# Patient Record
Sex: Female | Born: 1970 | Race: White | Hispanic: No | Marital: Married | Smoking: Never smoker
Health system: Southern US, Community
[De-identification: ages and names within clinical notes are randomized; demographics above are authoritative.]

## PROBLEM LIST (undated history)

## (undated) DIAGNOSIS — F329 Major depressive disorder, single episode, unspecified: Secondary | ICD-10-CM

## (undated) DIAGNOSIS — E039 Hypothyroidism, unspecified: Secondary | ICD-10-CM

## (undated) DIAGNOSIS — E079 Disorder of thyroid, unspecified: Secondary | ICD-10-CM

## (undated) DIAGNOSIS — F32A Depression, unspecified: Secondary | ICD-10-CM

## (undated) DIAGNOSIS — M797 Fibromyalgia: Secondary | ICD-10-CM

## (undated) DIAGNOSIS — F419 Anxiety disorder, unspecified: Secondary | ICD-10-CM

## (undated) HISTORY — PX: TONSILLECTOMY: SUR1361

## (undated) HISTORY — DX: Anxiety disorder, unspecified: F41.9

## (undated) HISTORY — DX: Major depressive disorder, single episode, unspecified: F32.9

## (undated) HISTORY — DX: Depression, unspecified: F32.A

## (undated) HISTORY — PX: LAPAROSCOPY ABDOMEN DIAGNOSTIC: PRO50

## (undated) HISTORY — DX: Disorder of thyroid, unspecified: E07.9

## (undated) HISTORY — DX: Fibromyalgia: M79.7

## (undated) HISTORY — PX: ABDOMINAL HYSTERECTOMY: SHX81

---

## 2006-05-16 DIAGNOSIS — M797 Fibromyalgia: Secondary | ICD-10-CM

## 2006-05-16 HISTORY — DX: Fibromyalgia: M79.7

## 2009-09-13 LAB — HM PAP SMEAR

## 2010-09-23 ENCOUNTER — Other Ambulatory Visit (INDEPENDENT_AMBULATORY_CARE_PROVIDER_SITE_OTHER): Payer: BC Managed Care – PPO

## 2010-09-23 ENCOUNTER — Ambulatory Visit (INDEPENDENT_AMBULATORY_CARE_PROVIDER_SITE_OTHER): Payer: BC Managed Care – PPO | Admitting: Internal Medicine

## 2010-09-23 ENCOUNTER — Encounter: Payer: Self-pay | Admitting: Internal Medicine

## 2010-09-23 VITALS — BP 110/70 | HR 90 | Temp 98.9°F | Resp 16 | Ht 65.0 in | Wt 130.0 lb

## 2010-09-23 DIAGNOSIS — R7982 Elevated C-reactive protein (CRP): Secondary | ICD-10-CM

## 2010-09-23 DIAGNOSIS — M797 Fibromyalgia: Secondary | ICD-10-CM

## 2010-09-23 DIAGNOSIS — IMO0001 Reserved for inherently not codable concepts without codable children: Secondary | ICD-10-CM

## 2010-09-23 DIAGNOSIS — E039 Hypothyroidism, unspecified: Secondary | ICD-10-CM

## 2010-09-23 LAB — COMPREHENSIVE METABOLIC PANEL
Albumin: 3.7 g/dL (ref 3.5–5.2)
BUN: 8 mg/dL (ref 6–23)
CO2: 27 mEq/L (ref 19–32)
Calcium: 8.8 mg/dL (ref 8.4–10.5)
Chloride: 104 mEq/L (ref 96–112)
GFR: 98.23 mL/min (ref >60.00)
Glucose, Bld: 95 mg/dL (ref 70–99)
Potassium: 4.1 mEq/L (ref 3.5–5.1)
Total Protein: 6.7 g/dL (ref 6.0–8.3)

## 2010-09-23 LAB — CBC WITH DIFFERENTIAL/PLATELET
Basophils Relative: 0.4 % (ref 0.0–3.0)
Eosinophils Absolute: 0 10*3/uL (ref 0.0–0.7)
Eosinophils Relative: 0.1 % (ref 0.0–5.0)
Hemoglobin: 13.9 g/dL (ref 12.0–15.0)
Lymphocytes Relative: 12.6 % (ref 12.0–46.0)
MCHC: 35.4 g/dL (ref 30.0–36.0)
Neutro Abs: 4.8 10*3/uL (ref 1.4–7.7)
RBC: 4.55 Mil/uL (ref 3.87–5.11)
WBC: 5.9 10*3/uL (ref 4.5–10.5)

## 2010-09-23 LAB — SEDIMENTATION RATE: Sed Rate: 7 mm/hr (ref 0–22)

## 2010-09-23 LAB — HIGH SENSITIVITY CRP: CRP, High Sensitivity: 26.55 mg/L — ABNORMAL HIGH (ref 0.00–5.00)

## 2010-09-23 MED ORDER — BUPRENORPHINE 5 MCG/HR TD PTWK: 1.0000 | MEDICATED_PATCH | TRANSDERMAL | Status: DC

## 2010-09-23 NOTE — Patient Instructions (Addendum)
Hypothyroidism The thyroid is a large gland located in the lower front of your neck. The thyroid gland helps control metabolism. Metabolism is how your body handles food. It controls metabolism with the hormone thyroxine. When this gland is underactive (hypothyroid), it produces too little hormone.  SYMPTOMS OF HYPOTHYROIDISM  Lethargy (feeling as though you have no energy)   Cold intolerance   Weight gain (in spite of normal food intake)   Dry skin   Coarse hair   Menstrual irregularity (if severe, may lead to infertility)   Slowing of thought processes  Cardiac problems are also caused by insufficient amounts of thyroid hormone. Hypothyroidism in the newborn is cretinism, and is an extreme form. It is important that this form be treated adequately and immediately or it will lead rapidly to retarded physical and mental development. CAUSES OF HYPOTHYROIDISM These include:   Absence or destruction of thyroid tissue.  Goiter due to iodine deficiency.   Goiter due to medications.   Congenital defects (since birth).  Problems with the pituitary. This causes a lack of TSH (thyroid stimulating hormone). This hormone tells the thyroid to turn out more hormone.   DIAGNOSIS To prove hypothyroidism, your caregiver may do blood tests and ultrasound tests. Sometimes the signs are hidden. It may be necessary for your caregiver to watch this illness with blood tests either before or after diagnosis and treatment. TREATMENT  Low levels of thyroid hormone are increased by using synthetic thyroid hormone. This is a safe, effective treatment. It usually takes about four weeks to gain the full effects of the medication. After you have the full effect of the medication, it will generally take another four weeks for problems to leave. Your caregiver may start you on low doses. If you have had heart problems the dose may be gradually increased. It is generally not an emergency to get rapidly to  normal. HOME CARE INSTRUCTIONS  Take your medications as your caregiver suggests. Let your caregiver know of any medications you are taking or start taking. Your caregiver will help you with dosage schedules.   As your condition improves, your dosage needs may increase. It will be necessary to have continuing blood tests as suggested by your caregiver.   Report all suspected medication side effects to your caregiver.  SEEK MEDICAL CARE IF YOU DEVELOP:  Sweating.  Tremulousness (tremors).   Anxiety.   Rapid weight loss.   Heat intolerance.  Emotional swings.   Diarrhea.   Weakness.   SEEK IMMEDIATE MEDICAL CARE IF: You develop chest pain, an irregular heart beat (palpitations), or a rapid heart beat. MAKE SURE YOU:   Understand these instructions.   Will watch your condition.   Will get help right away if you are not doing well or get worse.  Document Released: 05/02/2005 Document Re-Released: 04/14/2008 [facility] Patient Information 2011 [place], [place].Fibromyalgia Fibromyalgia is a disorder that is often misunderstood. It is associated with muscular pains and tenderness that comes and goes. It is often associated with fatigue and sleep disturbances. Though it tends to be long-lasting, fibromyalgia is not life-threatening. CAUSES The exact cause of fibromyalgia is unknown. People with certain gene types are predisposed to developing fibromyalgia and other conditions. Certain factors can play a role as triggers, such as:  Spine disorders.   Arthritis.   Severe injury (trauma) and other physical stressors.   Emotional stressors.  SYMPTOMS  The main symptom is pain and stiffness in the muscles and joints, which can vary over time.  Sleep and fatigue problems.  Other related symptoms may include:  Bowel and bladder problems.   Headaches.   Visual problems.   Problems with odors and noises.   Depression or mood changes.   Painful periods (dysmenorrhea).    Dryness of the skin or eyes.  DIAGNOSIS There are no specific tests for diagnosing fibromyalgia. Patients can be diagnosed accurately from the specific symptoms they have. The diagnosis is made by determining that nothing else is causing the problems. TREATMENT There is no cure. Management includes medicines and an active, healthy lifestyle. The goal is to enhance physical fitness, decrease pain, and improve sleep. HOME CARE INSTRUCTIONS  Only take over-the-counter or prescription medicines as directed by your caregiver. Sleeping pills, tranquilizers, and pain medicines may make your problems worse.   Low-impact aerobic exercise is very important and advised for treatment. At first, it may seem to make pain worse. Gradually increasing your tolerance will overcome this feeling.   Learning relaxation techniques and how to control stress will help you. Biofeedback, visual imagery, hypnosis, muscle relaxation, yoga, and meditation are all options.   Anti-inflammatory medicines and physical therapy may provide short-term help.   Acupuncture or massage treatments may help.   Take muscle relaxant medicines as suggested by your caregiver.   Avoid stressful situations.   Plan a healthy lifestyle. This includes your diet, sleep, rest, exercise, and friends.   Find and practice a hobby you enjoy.   Join a fibromyalgia support group for interaction, ideas, and sharing advice. This may be helpful.  SEEK MEDICAL CARE IF: You are not having good results or improvement from your treatment. FOR MORE INFORMATION National Fibromyalgia Association: www.fmaware.org Arthritis Foundation: www.arthritis.org Document Released: 05/02/2005 Document Re-Released: 10/20/2009 [facility] Patient Information 2011 [place], [place].

## 2010-09-24 ENCOUNTER — Encounter: Payer: Self-pay | Admitting: Internal Medicine

## 2010-09-24 NOTE — Progress Notes (Signed)
Subjective:    Patient ID: [name], female    DOB: Jan 19, 1971, 40 y.o.   MRN: [id number]  Thyroid Problem Presents for follow-up visit. Patient reports no anxiety, cold intolerance, constipation, depressed mood, diaphoresis, diarrhea, dry skin, fatigue, hair loss, heat intolerance, hoarse voice, leg swelling, menstrual problem, nail problem, palpitations, tremors, visual change, weight gain or weight loss. The symptoms have been stable.   New to me she complains of widespread pain for 4 years and she tells me that a PCP and Rheumatologist told her that she had fibromyalgia (she has no records today). Over the years she has tried nsaids, tylenol, tramadol, lyrica, acupuncture, and amitryptiline with no benefit and lots of side effects. She also saw Dr. 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 Today she has diffuse muscle and joint pain (especially in her knees.) She has not taken anything recently for pain.    Review of Systems  Constitutional: Negative for fever, chills, weight loss, weight gain, diaphoresis, activity change, appetite change, fatigue and unexpected weight change.  HENT: Negative for hoarse voice, facial swelling, neck pain and neck stiffness.   Respiratory: Negative for apnea, cough, choking, chest tightness, shortness of breath, wheezing and stridor.   Cardiovascular: Negative for chest pain, palpitations and leg swelling.  Gastrointestinal: Negative for nausea, vomiting, abdominal pain, diarrhea and constipation.  Genitourinary: Negative for dysuria, urgency, hematuria, flank pain, decreased urine volume, difficulty urinating, menstrual problem and dyspareunia.  Musculoskeletal: Positive for myalgias, back pain and arthralgias. Negative for joint swelling and gait problem.  Skin: Negative for color change, pallor and rash.  Neurological: Negative for dizziness, tremors, seizures, syncope, facial asymmetry, speech difficulty, weakness,  light-headedness, numbness and headaches.  Hematological: Negative for cold intolerance and heat intolerance.  Psychiatric/Behavioral: Negative for suicidal ideas, hallucinations, behavioral problems, confusion, sleep disturbance, self-injury, dysphoric mood, decreased concentration and agitation. The patient is not nervous/anxious and is not hyperactive.        Objective:   Physical Exam  Vitals reviewed. Constitutional: She is oriented to person, place, and time. She appears well-developed and well-nourished. No distress.  HENT:  Head: Normocephalic and atraumatic.  Right Ear: External ear normal.  Left Ear: External ear normal.  Nose: Nose normal.  Mouth/Throat: Oropharynx is clear and moist. No oropharyngeal exudate.  Eyes: Conjunctivae and EOM are normal. Pupils are equal, round, and reactive to light. Right eye exhibits no discharge. Left eye exhibits no discharge. No scleral icterus.  Neck: Normal range of motion. Neck supple. No JVD present. No tracheal deviation present. No thyromegaly present.  Cardiovascular: Normal rate, regular rhythm, normal heart sounds and intact distal pulses.  Exam reveals no gallop and no friction rub.   No murmur heard. Pulmonary/Chest: Effort normal and breath sounds normal. No stridor. No respiratory distress. She has no wheezes. She has no rales. She exhibits no tenderness.  Abdominal: Soft. Bowel sounds are normal. She exhibits no distension and no mass. There is no tenderness. There is no rebound and no guarding.  Musculoskeletal: Normal range of motion. She exhibits no edema and no tenderness.  Lymphadenopathy:    She has no cervical adenopathy.  Neurological: She is alert and oriented to person, place, and time. She has normal reflexes. She displays normal reflexes. No cranial nerve deficit. She exhibits normal muscle tone. Coordination normal.  Skin: Skin is warm and dry. No rash noted. She is not diaphoretic. No erythema. No pallor.    Psychiatric: She has a normal mood  and affect. Her behavior is normal. Judgment and thought content normal.          Assessment & Plan:

## 2010-09-24 NOTE — Assessment & Plan Note (Signed)
Will try butrans for pain and check her labs to look for secondary causes

## 2010-09-24 NOTE — Assessment & Plan Note (Signed)
It sounds like she is euthyroid, I will check her TSH level today

## 2010-09-24 NOTE — Assessment & Plan Note (Signed)
Her CRP is high so I have referred her to Rheumatology for evaluation

## 2010-10-08 ENCOUNTER — Telehealth: Payer: Self-pay | Admitting: *Deleted

## 2010-10-08 NOTE — Telephone Encounter (Signed)
Pt had "very" elevated CRP and was seen by rheumatologist. She says they r/o arthritis as cause. MD told her that cancer is one of the reasons pt's have elevation in this test. She is very worried and wants to know what Dr Yetta Barre thinks. She has been using the Butrans patch but only get 3 to 4 days of limited relief from patch.

## 2010-10-11 NOTE — Telephone Encounter (Signed)
She needs to be seen.

## 2010-10-14 ENCOUNTER — Ambulatory Visit: Payer: BC Managed Care – PPO | Admitting: Internal Medicine

## 2010-10-15 ENCOUNTER — Other Ambulatory Visit (INDEPENDENT_AMBULATORY_CARE_PROVIDER_SITE_OTHER): Payer: BC Managed Care – PPO

## 2010-10-15 ENCOUNTER — Ambulatory Visit (INDEPENDENT_AMBULATORY_CARE_PROVIDER_SITE_OTHER): Payer: BC Managed Care – PPO | Admitting: Internal Medicine

## 2010-10-15 ENCOUNTER — Ambulatory Visit (INDEPENDENT_AMBULATORY_CARE_PROVIDER_SITE_OTHER)
Admission: RE | Admit: 2010-10-15 | Discharge: 2010-10-15 | Disposition: A | Discharge status: Home / Self Care | Payer: BC Managed Care – PPO | Source: Ambulatory Visit | Attending: Internal Medicine | Admitting: Internal Medicine

## 2010-10-15 ENCOUNTER — Encounter: Payer: Self-pay | Admitting: Internal Medicine

## 2010-10-15 VITALS — BP 110/68 | HR 84 | Temp 98.5°F | Resp 16 | Wt 129.0 lb

## 2010-10-15 DIAGNOSIS — M797 Fibromyalgia: Secondary | ICD-10-CM

## 2010-10-15 DIAGNOSIS — IMO0001 Reserved for inherently not codable concepts without codable children: Secondary | ICD-10-CM

## 2010-10-15 DIAGNOSIS — R599 Enlarged lymph nodes, unspecified: Secondary | ICD-10-CM

## 2010-10-15 DIAGNOSIS — E039 Hypothyroidism, unspecified: Secondary | ICD-10-CM

## 2010-10-15 DIAGNOSIS — R7982 Elevated C-reactive protein (CRP): Secondary | ICD-10-CM

## 2010-10-15 DIAGNOSIS — R590 Localized enlarged lymph nodes: Secondary | ICD-10-CM

## 2010-10-15 LAB — CBC WITH DIFFERENTIAL/PLATELET
Eosinophils Relative: 1 % (ref 0.0–5.0)
Lymphocytes Relative: 39.3 % (ref 12.0–46.0)
MCV: 85.7 fl (ref 78.0–100.0)
Monocytes Absolute: 0.4 10*3/uL (ref 0.1–1.0)
Monocytes Relative: 7.2 % (ref 3.0–12.0)
Neutrophils Relative %: 52.1 % (ref 43.0–77.0)
Platelets: 247 10*3/uL (ref 150.0–400.0)
RBC: 4.21 Mil/uL (ref 3.87–5.11)
WBC: 4.9 10*3/uL (ref 4.5–10.5)

## 2010-10-15 LAB — HEPATITIS C ANTIBODY: HCV Ab: NEGATIVE

## 2010-10-15 LAB — SEDIMENTATION RATE: Sed Rate: 7 mm/hr (ref 0–22)

## 2010-10-15 LAB — HIV ANTIBODY (ROUTINE TESTING W REFLEX): HIV: NONREACTIVE

## 2010-10-15 LAB — HIGH SENSITIVITY CRP: CRP, High Sensitivity: 2.53 mg/L (ref 0.00–5.00)

## 2010-10-15 MED ORDER — BUPRENORPHINE 10 MCG/HR TD PTWK: 1.0000 | MEDICATED_PATCH | TRANSDERMAL | Status: DC

## 2010-10-15 NOTE — Assessment & Plan Note (Signed)
Her TSH was recently normal

## 2010-10-15 NOTE — Patient Instructions (Signed)

## 2010-10-15 NOTE — Assessment & Plan Note (Signed)
Increased strength of butrans today

## 2010-10-15 NOTE — Assessment & Plan Note (Signed)
The nodes feel benign but I will check some labs to look for other causes

## 2010-10-15 NOTE — Assessment & Plan Note (Signed)
I will repeat the CRP level today and look at some other labs to screen for lymphoproliferative disease

## 2010-10-15 NOTE — Progress Notes (Signed)
Subjective:    Patient ID: [name], female    DOB: May 29, 1970, 40 y.o.   MRN: [id number]  HPI She returns for f/up and she tells me that she saw Dr. Kellie Simmering (Rheumatology) and he told her that she did not have any evidence of arthritis but he was concerned that the elevated CRP may be a sign of cancer so she returns for further evaluation. She tells me that the [facility] patch has helped some but she would like to try a higher dose.   Review of Systems  Constitutional: Negative for fever, chills, diaphoresis, activity change, appetite change, fatigue and unexpected weight change.  HENT: Negative for nosebleeds, sore throat, facial swelling, trouble swallowing, neck pain, neck stiffness and voice change.   Eyes: Negative for photophobia and visual disturbance.  Respiratory: Negative for cough, chest tightness, shortness of breath, wheezing and stridor.   Cardiovascular: Negative for chest pain, palpitations and leg swelling.  Gastrointestinal: Negative for nausea, vomiting, abdominal pain, diarrhea, constipation, blood in stool, abdominal distention, anal bleeding and rectal pain.  Genitourinary: Negative for dysuria, urgency, frequency, hematuria, flank pain, decreased urine volume, vaginal bleeding, vaginal discharge, enuresis, difficulty urinating, genital sores, vaginal pain, menstrual problem, pelvic pain and dyspareunia.  Musculoskeletal: Positive for myalgias and arthralgias. Negative for back pain, joint swelling and gait problem.  Skin: Negative for color change, pallor and rash.  Neurological: Negative for dizziness, tremors, seizures, syncope, facial asymmetry, speech difficulty, weakness, light-headedness, numbness and headaches.  Hematological: Negative for adenopathy. Does not bruise/bleed easily.  Psychiatric/Behavioral: Negative for suicidal ideas, hallucinations, behavioral problems, confusion, sleep disturbance, self-injury, dysphoric mood, decreased concentration and  agitation. The patient is not nervous/anxious and is not hyperactive.        Objective:   Physical Exam  [vitalsreviewed. Constitutional: She is oriented to person, place, and time. She appears well-developed and well-nourished. No distress.  HENT:  Head: Normocephalic and atraumatic.  Right Ear: External ear normal.  Left Ear: External ear normal.  Nose: Nose normal.  Mouth/Throat: Oropharynx is clear and moist. No oropharyngeal exudate.  Eyes: Conjunctivae and EOM are normal. Pupils are equal, round, and reactive to light. Right eye exhibits no discharge. Left eye exhibits no discharge. No scleral icterus.  Neck: Normal range of motion. Neck supple. No JVD present. No tracheal deviation present. No thyromegaly present.  Cardiovascular: Normal rate, regular rhythm, normal heart sounds and intact distal pulses.  Exam reveals no gallop and no friction rub.   No murmur heard. Pulmonary/Chest: Effort normal and breath sounds normal. No stridor. No respiratory distress. She has no wheezes. She has no rales. She exhibits no tenderness.  Abdominal: Soft. Bowel sounds are normal. She exhibits no distension and no mass. There is no tenderness. There is no rebound and no guarding.  Musculoskeletal: Normal range of motion. She exhibits no edema and no tenderness.  Lymphadenopathy:       Head (right side): No submental, no submandibular, no tonsillar, no preauricular, no posterior auricular and no occipital adenopathy present.       Head (left side): No submental, no submandibular, no tonsillar, no preauricular, no posterior auricular and no occipital adenopathy present.    She has no cervical adenopathy.       Right cervical: No superficial cervical, no deep cervical and no posterior cervical adenopathy present.      Left cervical: No superficial cervical, no deep cervical and no posterior cervical adenopathy present.       Right axillary: No pectoral and no lateral  adenopathy present.       Left  axillary: No pectoral and no lateral adenopathy present.      Right: Inguinal adenopathy present. No supraclavicular and no epitrochlear adenopathy present.       Left: Inguinal adenopathy present. No supraclavicular and no epitrochlear adenopathy present.  Neurological: She is alert and oriented to person, place, and time. She has normal reflexes. She displays normal reflexes. No cranial nerve deficit. She exhibits normal muscle tone. Coordination normal.  Skin: Skin is warm and dry. No rash noted. She is not diaphoretic. No erythema. No pallor.  Psychiatric: She has a normal mood and affect. Her behavior is normal. Judgment and thought content normal.        Lab Results  Component Value Date   WBC 4.9 10/15/2010   HGB 12.6 10/15/2010   HCT 36.1 10/15/2010   PLT 247.0 10/15/2010   ALT 16 09/23/2010   AST 16 09/23/2010   NA 138 09/23/2010   K 4.1 09/23/2010   CL 104 09/23/2010   CREATININE 0.7 09/23/2010   BUN 8 09/23/2010   CO2 27 09/23/2010   TSH 1.25 09/23/2010    Assessment & Plan:

## 2010-10-19 LAB — PROTEIN ELECTROPHORESIS, SERUM
Albumin ELP: 62 % (ref 55.8–66.1)
Alpha-2-Globulin: 9.6 % (ref 7.1–11.8)
Total Protein, Serum Electrophoresis: 6.7 g/dL (ref 6.0–8.3)

## 2010-10-22 ENCOUNTER — Telehealth: Payer: Self-pay

## 2010-10-22 NOTE — Telephone Encounter (Signed)
Patient called [facility] requesting a call back to discuss med change (didnt mention name of med).

## 2010-11-04 ENCOUNTER — Telehealth: Payer: Self-pay | Admitting: *Deleted

## 2010-11-04 DIAGNOSIS — M797 Fibromyalgia: Secondary | ICD-10-CM

## 2010-11-04 MED ORDER — HYDROCODONE-ACETAMINOPHEN 5-500 MG PO TABS: 2.0000 | ORAL_TABLET | Freq: Three times a day (TID) | ORAL | Status: DC | PRN

## 2010-11-04 NOTE — Telephone Encounter (Signed)
pls call it in 

## 2010-11-04 NOTE — Telephone Encounter (Signed)
Pt was given Rx for butrans patch but insurance does not cover med. She would like an alternative RX.

## 2010-11-04 NOTE — Telephone Encounter (Signed)
Patient informed, RX CALLED IN

## 2010-11-15 ENCOUNTER — Encounter: Payer: Self-pay | Admitting: Internal Medicine

## 2010-11-15 ENCOUNTER — Ambulatory Visit: Payer: BC Managed Care – PPO | Admitting: Internal Medicine

## 2010-11-15 ENCOUNTER — Ambulatory Visit (INDEPENDENT_AMBULATORY_CARE_PROVIDER_SITE_OTHER): Payer: BC Managed Care – PPO | Admitting: Internal Medicine

## 2010-11-15 VITALS — BP 116/68 | HR 73 | Temp 98.6°F | Wt 130.0 lb

## 2010-11-15 DIAGNOSIS — E039 Hypothyroidism, unspecified: Secondary | ICD-10-CM

## 2010-11-15 DIAGNOSIS — M5416 Radiculopathy, lumbar region: Secondary | ICD-10-CM

## 2010-11-15 DIAGNOSIS — M797 Fibromyalgia: Secondary | ICD-10-CM

## 2010-11-15 DIAGNOSIS — R209 Unspecified disturbances of skin sensation: Secondary | ICD-10-CM

## 2010-11-15 DIAGNOSIS — R200 Anesthesia of skin: Secondary | ICD-10-CM | POA: Insufficient documentation

## 2010-11-15 DIAGNOSIS — Z23 Encounter for immunization: Secondary | ICD-10-CM

## 2010-11-15 DIAGNOSIS — IMO0002 Reserved for concepts with insufficient information to code with codable children: Secondary | ICD-10-CM

## 2010-11-15 DIAGNOSIS — IMO0001 Reserved for inherently not codable concepts without codable children: Secondary | ICD-10-CM

## 2010-11-15 MED ORDER — MILNACIPRAN HCL 12.5 & 25 & 50 MG PO MISC: 50.0000 mg | Freq: Two times a day (BID) | ORAL | Status: DC

## 2010-11-15 NOTE — Assessment & Plan Note (Signed)
Stop Vicodin and try Savella

## 2010-11-15 NOTE — Assessment & Plan Note (Signed)
Check NCS and EMG

## 2010-11-15 NOTE — Patient Instructions (Signed)

## 2010-11-15 NOTE — Assessment & Plan Note (Signed)
Check NCS and EMG 

## 2010-11-15 NOTE — Progress Notes (Signed)
Subjective:    Patient ID: [name], female    DOB: [birth year], 40 y.o.   MRN: [id number]  HPI She returns for f/up and tells me that she has persistent pain and stiffness, mostly in her hands. She was not able to afford Butrans patch and says that Vicodin did not help. She has never tried [place] but she did try Cymbalta, Lyrica, and Amitryptiline and did not get any relief. She saw Dr. Kellie Simmering and he told her that she did not have arthritis.  Also, she needs a f/up on low back pain. She tells me that in 2008 she saw an ortho and was told that she had DDD and a pinched nerve but she did not have any surgery done. She has persistent LBP that shoots into both legs and has intermittent numbness in both of her legs.    Review of Systems  Constitutional: Negative for fever, chills, diaphoresis, activity change, appetite change, fatigue and unexpected weight change.  HENT: Negative for sore throat, facial swelling, trouble swallowing, neck pain, neck stiffness and voice change.   Eyes: Negative.   Respiratory: Negative for apnea, cough, choking, chest tightness, shortness of breath, wheezing and stridor.   Cardiovascular: Negative for chest pain, palpitations and leg swelling.  Gastrointestinal: Negative for nausea, vomiting, abdominal pain, diarrhea, constipation, blood in stool, abdominal distention, anal bleeding and rectal pain.  Genitourinary: Negative for dysuria, urgency, frequency, hematuria, flank pain, decreased urine volume, enuresis, difficulty urinating, genital sores and dyspareunia.  Musculoskeletal: Positive for myalgias and arthralgias. Negative for back pain, joint swelling and gait problem.  Skin: Negative for color change, pallor, rash and wound.  Neurological: Negative for dizziness, tremors, seizures, syncope, facial asymmetry, speech difficulty, weakness, light-headedness, numbness and headaches.  Hematological: Negative for adenopathy. Does not bruise/bleed easily.    Psychiatric/Behavioral: Negative for suicidal ideas, hallucinations, behavioral problems, confusion, sleep disturbance, self-injury, dysphoric mood, decreased concentration and agitation. The patient is not nervous/anxious and is not hyperactive.        Objective:   Physical Exam  Vitals reviewed. Constitutional: She is oriented to person, place, and time. She appears well-developed and well-nourished. No distress.  HENT:  Head: Normocephalic and atraumatic.  Right Ear: External ear normal.  Left Ear: External ear normal.  Nose: Nose normal.  Mouth/Throat: Oropharynx is clear and moist. No oropharyngeal exudate.  Eyes: Conjunctivae and EOM are normal. Pupils are equal, round, and reactive to light. Right eye exhibits no discharge. Left eye exhibits no discharge. No scleral icterus.  Neck: Normal range of motion. Neck supple. No JVD present. No tracheal deviation present. No thyromegaly present.  Cardiovascular: Normal rate, regular rhythm, normal heart sounds and intact distal pulses.  Exam reveals no gallop and no friction rub.   No murmur heard. Pulmonary/Chest: Effort normal and breath sounds normal. No stridor. No respiratory distress. She has no wheezes. She has no rales. She exhibits no tenderness.  Abdominal: Soft. Bowel sounds are normal. She exhibits no distension and no mass. There is no tenderness. There is no rebound and no guarding.  Musculoskeletal: Normal range of motion. She exhibits no edema and no tenderness.       Lumbar back: Normal. She exhibits normal range of motion, no tenderness, no bony tenderness, no swelling, no edema, no deformity, no laceration, no pain and no spasm.  Lymphadenopathy:    She has no cervical adenopathy.  Neurological: She is alert and oriented to person, place, and time. She displays no atrophy, no tremor and normal reflexes.  No cranial nerve deficit or sensory deficit. She exhibits normal muscle tone. She displays a negative Romberg sign.  Coordination and gait normal.  Reflex Scores:      Tricep reflexes are 1+ on the right side and 1+ on the left side.      Bicep reflexes are 1+ on the right side and 1+ on the left side.      Brachioradialis reflexes are 1+ on the right side and 1+ on the left side.      Patellar reflexes are 1+ on the right side and 1+ on the left side.      Achilles reflexes are 1+ on the right side and 1+ on the left side. Skin: Skin is warm and dry. No rash noted. She is not diaphoretic. No erythema. No pallor.  Psychiatric: She has a normal mood and affect. Her behavior is normal. Judgment and thought content normal.        Lab Results  Component Value Date   WBC 4.9 10/15/2010   HGB 12.6 10/15/2010   HCT 36.1 10/15/2010   PLT 247.0 10/15/2010   ALT 16 09/23/2010   AST 16 09/23/2010   NA 138 09/23/2010   K 4.1 09/23/2010   CL 104 09/23/2010   CREATININE 0.7 09/23/2010   BUN 8 09/23/2010   CO2 27 09/23/2010   TSH 1.25 09/23/2010    Assessment & Plan:

## 2010-11-15 NOTE — Assessment & Plan Note (Signed)
Recent TSH was normal 

## 2010-12-15 ENCOUNTER — Ambulatory Visit: Payer: BC Managed Care – PPO | Admitting: Internal Medicine

## 2012-01-03 ENCOUNTER — Other Ambulatory Visit (HOSPITAL_COMMUNITY): Payer: Self-pay | Admitting: Orthopaedic Surgery

## 2012-01-03 DIAGNOSIS — M549 Dorsalgia, unspecified: Secondary | ICD-10-CM

## 2012-01-03 DIAGNOSIS — R200 Anesthesia of skin: Secondary | ICD-10-CM

## 2012-01-06 ENCOUNTER — Ambulatory Visit (HOSPITAL_COMMUNITY)
Admission: RE | Admit: 2012-01-06 | Discharge: 2012-01-06 | Disposition: A | Discharge status: Home / Self Care | Payer: Self-pay | Source: Ambulatory Visit | Attending: Orthopaedic Surgery | Admitting: Orthopaedic Surgery

## 2012-01-06 DIAGNOSIS — M549 Dorsalgia, unspecified: Secondary | ICD-10-CM

## 2012-01-06 DIAGNOSIS — R200 Anesthesia of skin: Secondary | ICD-10-CM

## 2012-01-12 ENCOUNTER — Ambulatory Visit (HOSPITAL_COMMUNITY)
Admission: RE | Admit: 2012-01-12 | Discharge: 2012-01-12 | Disposition: A | Discharge status: Home / Self Care | Payer: Self-pay | Source: Ambulatory Visit | Attending: Orthopaedic Surgery | Admitting: Orthopaedic Surgery

## 2012-01-12 DIAGNOSIS — M545 Low back pain, unspecified: Secondary | ICD-10-CM | POA: Insufficient documentation

## 2012-01-12 DIAGNOSIS — R209 Unspecified disturbances of skin sensation: Secondary | ICD-10-CM | POA: Insufficient documentation

## 2013-01-15 ENCOUNTER — Encounter (HOSPITAL_COMMUNITY): Payer: Self-pay | Admitting: Pharmacist

## 2013-01-22 ENCOUNTER — Other Ambulatory Visit: Payer: Self-pay | Admitting: Obstetrics and Gynecology

## 2013-01-31 ENCOUNTER — Encounter (HOSPITAL_COMMUNITY)
Admission: RE | Admit: 2013-01-31 | Discharge: 2013-01-31 | Disposition: A | Discharge status: Home / Self Care | Payer: BC Managed Care – PPO | Source: Ambulatory Visit | Attending: Obstetrics and Gynecology | Admitting: Obstetrics and Gynecology

## 2013-01-31 ENCOUNTER — Encounter (HOSPITAL_COMMUNITY): Payer: Self-pay

## 2013-01-31 DIAGNOSIS — Z01812 Encounter for preprocedural laboratory examination: Secondary | ICD-10-CM | POA: Insufficient documentation

## 2013-01-31 DIAGNOSIS — Z01818 Encounter for other preprocedural examination: Secondary | ICD-10-CM | POA: Insufficient documentation

## 2013-01-31 HISTORY — DX: Hypothyroidism, unspecified: E03.9

## 2013-01-31 LAB — CBC
Hemoglobin: 13.7 g/dL (ref 12.0–15.0)
MCH: 29.1 pg (ref 26.0–34.0)
MCHC: 35.3 g/dL (ref 30.0–36.0)
MCV: 82.6 fL (ref 78.0–100.0)
RBC: 4.7 MIL/uL (ref 3.87–5.11)

## 2013-01-31 NOTE — Patient Instructions (Addendum)
[address] Lou[address]co  01/31/2013   Your procedure is scheduled on:  02/05/13  Enter through the Main Entrance of [facility] at 6 AM.  Pick up the phone at the desk and dial [phone].   Call this number if you have problems the morning of surgery: [phone]   Remember:   Do not eat food:After Midnight.  Do not drink clear liquids: After Midnight.  Take these medicines the morning of surgery with A SIP OF WATER: NA   Do not wear jewelry, make-up or nail polish.  Do not wear lotions, powders, or perfumes. You may wear deodorant.  Do not shave 48 hours prior to surgery.  Do not bring valuables to the hospital.  [facility] is not   responsible for any belongings or valuables brought to the hospital.  Contacts, dentures or bridgework may not be worn into surgery.  Leave suitcase in the car. After surgery it may be brought to your room.  For patients admitted to the hospital, checkout time is 11:00 AM the day of              discharge.   Patients discharged the day of surgery will not be allowed to drive             home.  Name and phone number of your driver: NA  Special Instructions:   Shower using CHG 2 nights before surgery and the night before surgery.  If you shower the day of surgery use CHG.  Use special wash - you have one bottle of CHG for all showers.  You should use approximately 1/3 of the bottle for each shower.   Please read over the following fact sheets that you were given:   Surgical Site Infection Prevention

## 2013-02-05 ENCOUNTER — Encounter (HOSPITAL_COMMUNITY)
Admission: RE | Disposition: A | Discharge status: Home / Self Care | Payer: Self-pay | Source: Ambulatory Visit | Attending: Obstetrics and Gynecology

## 2013-02-05 ENCOUNTER — Encounter (HOSPITAL_COMMUNITY): Payer: Self-pay | Admitting: Certified Registered Nurse Anesthetist

## 2013-02-05 ENCOUNTER — Encounter (HOSPITAL_COMMUNITY): Payer: Self-pay | Admitting: *Deleted

## 2013-02-05 ENCOUNTER — Observation Stay (HOSPITAL_COMMUNITY)
Admission: RE | Admit: 2013-02-05 | Discharge: 2013-02-06 | Disposition: A | Discharge status: Home / Self Care | Payer: BC Managed Care – PPO | Source: Ambulatory Visit | Attending: Obstetrics and Gynecology | Admitting: Obstetrics and Gynecology

## 2013-02-05 ENCOUNTER — Ambulatory Visit (HOSPITAL_COMMUNITY): Payer: BC Managed Care – PPO | Admitting: Certified Registered Nurse Anesthetist

## 2013-02-05 DIAGNOSIS — N803 Endometriosis of pelvic peritoneum, unspecified: Secondary | ICD-10-CM | POA: Insufficient documentation

## 2013-02-05 DIAGNOSIS — N946 Dysmenorrhea, unspecified: Secondary | ICD-10-CM | POA: Insufficient documentation

## 2013-02-05 DIAGNOSIS — D252 Subserosal leiomyoma of uterus: Secondary | ICD-10-CM | POA: Insufficient documentation

## 2013-02-05 DIAGNOSIS — D250 Submucous leiomyoma of uterus: Secondary | ICD-10-CM | POA: Insufficient documentation

## 2013-02-05 DIAGNOSIS — R102 Pelvic and perineal pain: Secondary | ICD-10-CM

## 2013-02-05 DIAGNOSIS — G8929 Other chronic pain: Principal | ICD-10-CM | POA: Insufficient documentation

## 2013-02-05 DIAGNOSIS — N949 Unspecified condition associated with female genital organs and menstrual cycle: Secondary | ICD-10-CM | POA: Insufficient documentation

## 2013-02-05 DIAGNOSIS — IMO0002 Reserved for concepts with insufficient information to code with codable children: Secondary | ICD-10-CM | POA: Insufficient documentation

## 2013-02-05 HISTORY — PX: LAPAROSCOPY: SHX197

## 2013-02-05 HISTORY — PX: VAGINAL HYSTERECTOMY: SHX2639

## 2013-02-05 HISTORY — PX: ABLATION ON ENDOMETRIOSIS: SHX5787

## 2013-02-05 LAB — PREGNANCY, URINE: Preg Test, Ur: NEGATIVE

## 2013-02-05 SURGERY — HYSTERECTOMY, VAGINAL
Anesthesia: General | Site: Vagina | Wound class: Clean Contaminated

## 2013-02-05 MED ORDER — ONDANSETRON HCL 4 MG/2ML IJ SOLN
INTRAMUSCULAR | Status: DC | PRN
  Administered 2013-02-05 (×2): 2 mg via INTRAVENOUS

## 2013-02-05 MED ORDER — ZOLPIDEM TARTRATE 5 MG PO TABS: 5.0000 mg | ORAL_TABLET | Freq: Every evening | ORAL | Status: DC | PRN

## 2013-02-05 MED ORDER — DOCUSATE SODIUM 100 MG PO CAPS
100.0000 mg | ORAL_CAPSULE | Freq: Two times a day (BID) | ORAL | Status: DC
  Administered 2013-02-05 – 2013-02-06 (×2): 100 mg via ORAL
  Filled 2013-02-05 (×2): qty 1

## 2013-02-05 MED ORDER — DEXAMETHASONE SODIUM PHOSPHATE 10 MG/ML IJ SOLN
INTRAMUSCULAR | Status: DC | PRN
  Administered 2013-02-05: 10 mg via INTRAVENOUS

## 2013-02-05 MED ORDER — SODIUM CHLORIDE 0.9 % IJ SOLN: 9.0000 mL | INTRAMUSCULAR | Status: DC | PRN

## 2013-02-05 MED ORDER — LIDOCAINE-EPINEPHRINE 1 %-1:100000 IJ SOLN
INTRAMUSCULAR | Status: DC | PRN
  Administered 2013-02-05: 7 mg

## 2013-02-05 MED ORDER — ONDANSETRON HCL 4 MG PO TABS: 4.0000 mg | ORAL_TABLET | Freq: Four times a day (QID) | ORAL | Status: DC | PRN

## 2013-02-05 MED ORDER — ONDANSETRON HCL 4 MG/2ML IJ SOLN
4.0000 mg | Freq: Four times a day (QID) | INTRAMUSCULAR | Status: DC | PRN
  Administered 2013-02-05: 4 mg via INTRAVENOUS

## 2013-02-05 MED ORDER — ROCURONIUM BROMIDE 50 MG/5ML IV SOLN
INTRAVENOUS | Status: AC
  Filled 2013-02-05: qty 1

## 2013-02-05 MED ORDER — PROPOFOL 10 MG/ML IV EMUL
INTRAVENOUS | Status: AC
  Filled 2013-02-05: qty 20

## 2013-02-05 MED ORDER — DIPHENHYDRAMINE HCL 12.5 MG/5ML PO ELIX: 12.5000 mg | ORAL_SOLUTION | Freq: Four times a day (QID) | ORAL | Status: DC | PRN

## 2013-02-05 MED ORDER — ACETAMINOPHEN 160 MG/5ML PO SOLN
975.0000 mg | Freq: Four times a day (QID) | ORAL | Status: DC | PRN
  Administered 2013-02-05: 975 mg via ORAL

## 2013-02-05 MED ORDER — ONDANSETRON HCL 4 MG/2ML IJ SOLN
INTRAMUSCULAR | Status: AC
  Filled 2013-02-05: qty 2

## 2013-02-05 MED ORDER — GLYCOPYRROLATE 0.2 MG/ML IJ SOLN
INTRAMUSCULAR | Status: AC
  Filled 2013-02-05: qty 3

## 2013-02-05 MED ORDER — HYDROMORPHONE 0.3 MG/ML IV SOLN
INTRAVENOUS | Status: DC
  Administered 2013-02-05: 7.7 mL via INTRAVENOUS
  Administered 2013-02-05: 0.2 mL via INTRAVENOUS
  Administered 2013-02-05: 4.12 mg via INTRAVENOUS
  Administered 2013-02-05: 15.33 mL via INTRAVENOUS
  Filled 2013-02-05 (×2): qty 25

## 2013-02-05 MED ORDER — MIDAZOLAM HCL 2 MG/2ML IJ SOLN
INTRAMUSCULAR | Status: DC | PRN
  Administered 2013-02-05: .5 mg via INTRAVENOUS
  Administered 2013-02-05: 1.5 mg via INTRAVENOUS

## 2013-02-05 MED ORDER — FENTANYL CITRATE 0.05 MG/ML IJ SOLN
INTRAMUSCULAR | Status: DC | PRN
  Administered 2013-02-05 (×3): 50 ug via INTRAVENOUS
  Administered 2013-02-05: 100 ug via INTRAVENOUS

## 2013-02-05 MED ORDER — BUPIVACAINE HCL (PF) 0.25 % IJ SOLN
INTRAMUSCULAR | Status: DC | PRN
  Administered 2013-02-05: 4 mL

## 2013-02-05 MED ORDER — SIMETHICONE 80 MG PO CHEW: 80.0000 mg | CHEWABLE_TABLET | Freq: Four times a day (QID) | ORAL | Status: DC | PRN

## 2013-02-05 MED ORDER — HYDROMORPHONE HCL PF 1 MG/ML IJ SOLN
INTRAMUSCULAR | Status: AC
  Filled 2013-02-05: qty 1

## 2013-02-05 MED ORDER — HYDROMORPHONE HCL PF 1 MG/ML IJ SOLN
0.2500 mg | INTRAMUSCULAR | Status: DC | PRN
  Administered 2013-02-05 (×6): 0.5 mg via INTRAVENOUS

## 2013-02-05 MED ORDER — DEXTROSE-NACL 5-0.45 % IV SOLN
INTRAVENOUS | Status: DC
  Administered 2013-02-05: 19:00:00 via INTRAVENOUS
  Administered 2013-02-05: 125 mL via INTRAVENOUS
  Administered 2013-02-06: 03:00:00 via INTRAVENOUS

## 2013-02-05 MED ORDER — FENTANYL CITRATE 0.05 MG/ML IJ SOLN
INTRAMUSCULAR | Status: AC
  Filled 2013-02-05: qty 5

## 2013-02-05 MED ORDER — CEFAZOLIN SODIUM-DEXTROSE 2-3 GM-% IV SOLR
2.0000 g | INTRAVENOUS | Status: AC
  Administered 2013-02-05: 2 g via INTRAVENOUS

## 2013-02-05 MED ORDER — DIPHENHYDRAMINE HCL 50 MG/ML IJ SOLN: 12.5000 mg | Freq: Four times a day (QID) | INTRAMUSCULAR | Status: DC | PRN

## 2013-02-05 MED ORDER — OXYCODONE-ACETAMINOPHEN 5-325 MG PO TABS
1.0000 | ORAL_TABLET | ORAL | Status: DC | PRN
  Administered 2013-02-05 – 2013-02-06 (×3): 2 via ORAL
  Filled 2013-02-05 (×2): qty 2

## 2013-02-05 MED ORDER — DEXAMETHASONE SODIUM PHOSPHATE 10 MG/ML IJ SOLN
INTRAMUSCULAR | Status: AC
  Filled 2013-02-05: qty 1

## 2013-02-05 MED ORDER — PROPOFOL 10 MG/ML IV BOLUS
INTRAVENOUS | Status: DC | PRN
  Administered 2013-02-05: 150 mg via INTRAVENOUS

## 2013-02-05 MED ORDER — HYDROMORPHONE HCL PF 1 MG/ML IJ SOLN
INTRAMUSCULAR | Status: DC | PRN
  Administered 2013-02-05: 1 mg via INTRAVENOUS

## 2013-02-05 MED ORDER — ONDANSETRON HCL 4 MG/2ML IJ SOLN
4.0000 mg | Freq: Four times a day (QID) | INTRAMUSCULAR | Status: DC | PRN
  Filled 2013-02-05: qty 2

## 2013-02-05 MED ORDER — ACETAMINOPHEN 160 MG/5ML PO SOLN
ORAL | Status: AC
  Administered 2013-02-05: 975 mg via ORAL
  Filled 2013-02-05: qty 40.6

## 2013-02-05 MED ORDER — LACTATED RINGERS IV SOLN
INTRAVENOUS | Status: DC
  Administered 2013-02-05 (×3): via INTRAVENOUS

## 2013-02-05 MED ORDER — LIDOCAINE HCL (CARDIAC) 20 MG/ML IV SOLN
INTRAVENOUS | Status: DC | PRN
  Administered 2013-02-05: 50 mg via INTRAVENOUS

## 2013-02-05 MED ORDER — LEVOTHYROXINE SODIUM 75 MCG PO TABS
75.0000 ug | ORAL_TABLET | Freq: Every day | ORAL | Status: DC
  Administered 2013-02-05 – 2013-02-06 (×2): 75 ug via ORAL
  Filled 2013-02-05 (×3): qty 1

## 2013-02-05 MED ORDER — HYDROMORPHONE HCL PF 1 MG/ML IJ SOLN
INTRAMUSCULAR | Status: AC
  Administered 2013-02-05: 0.5 mg via INTRAVENOUS
  Filled 2013-02-05: qty 1

## 2013-02-05 MED ORDER — KETOROLAC TROMETHAMINE 30 MG/ML IJ SOLN
INTRAMUSCULAR | Status: DC | PRN
  Administered 2013-02-05: 30 mg via INTRAVENOUS

## 2013-02-05 MED ORDER — ROCURONIUM BROMIDE 100 MG/10ML IV SOLN
INTRAVENOUS | Status: DC | PRN
  Administered 2013-02-05: 30 mg via INTRAVENOUS

## 2013-02-05 MED ORDER — NEOSTIGMINE METHYLSULFATE 1 MG/ML IJ SOLN
INTRAMUSCULAR | Status: AC
  Filled 2013-02-05: qty 1

## 2013-02-05 MED ORDER — KETOROLAC TROMETHAMINE 30 MG/ML IJ SOLN
INTRAMUSCULAR | Status: AC
  Filled 2013-02-05: qty 1

## 2013-02-05 MED ORDER — BUPIVACAINE HCL (PF) 0.25 % IJ SOLN
INTRAMUSCULAR | Status: AC
  Filled 2013-02-05: qty 30

## 2013-02-05 MED ORDER — GLYCOPYRROLATE 0.2 MG/ML IJ SOLN
INTRAMUSCULAR | Status: DC | PRN
  Administered 2013-02-05: 0.4 mg via INTRAVENOUS
  Administered 2013-02-05 (×2): 0.1 mg via INTRAVENOUS

## 2013-02-05 MED ORDER — NALOXONE HCL 0.4 MG/ML IJ SOLN: 0.4000 mg | INTRAMUSCULAR | Status: DC | PRN

## 2013-02-05 MED ORDER — LIDOCAINE HCL (CARDIAC) 20 MG/ML IV SOLN
INTRAVENOUS | Status: AC
  Filled 2013-02-05: qty 5

## 2013-02-05 MED ORDER — MIDAZOLAM HCL 2 MG/2ML IJ SOLN
INTRAMUSCULAR | Status: AC
  Filled 2013-02-05: qty 2

## 2013-02-05 MED ORDER — NEOSTIGMINE METHYLSULFATE 1 MG/ML IJ SOLN
INTRAMUSCULAR | Status: DC | PRN
  Administered 2013-02-05: 3 mg via INTRAVENOUS

## 2013-02-05 MED ORDER — CEFAZOLIN SODIUM-DEXTROSE 2-3 GM-% IV SOLR
INTRAVENOUS | Status: AC
  Filled 2013-02-05: qty 50

## 2013-02-05 SURGICAL SUPPLY — 33 items
CATH ROBINSON RED A/P 16FR (CATHETERS) IMPLANT
CLOTH BEACON ORANGE TIMEOUT ST (SAFETY) ×4 IMPLANT
COVER TABLE BACK 60X90 (DRAPES) ×4 IMPLANT
DECANTER SPIKE VIAL GLASS SM (MISCELLANEOUS) IMPLANT
DERMABOND ADVANCED (GAUZE/BANDAGES/DRESSINGS) ×1
DERMABOND ADVANCED .7 DNX12 (GAUZE/BANDAGES/DRESSINGS) ×3 IMPLANT
ELECT REM PT RETURN 9FT ADLT (ELECTROSURGICAL)
ELECTRODE REM PT RTRN 9FT ADLT (ELECTROSURGICAL) IMPLANT
EVACUATOR SMOKE 8.L (FILTER) IMPLANT
FORCEPS CUTTING 33CM 5MM (CUTTING FORCEPS) IMPLANT
GLOVE ECLIPSE 7.0 STRL STRAW (GLOVE) ×8 IMPLANT
GOWN STRL REIN XL XLG (GOWN DISPOSABLE) ×16 IMPLANT
NS IRRIG 1000ML POUR BTL (IV SOLUTION) ×4 IMPLANT
PACK LAVH (CUSTOM PROCEDURE TRAY) ×4 IMPLANT
PROTECTOR NERVE ULNAR (MISCELLANEOUS) ×4 IMPLANT
SCISSORS LAP 5X35 DISP (ENDOMECHANICALS) IMPLANT
SET IRRIG TUBING LAPAROSCOPIC (IRRIGATION / IRRIGATOR) IMPLANT
SOLUTION ELECTROLUBE (MISCELLANEOUS) IMPLANT
STRIP CLOSURE SKIN 1/4X3 (GAUZE/BANDAGES/DRESSINGS) IMPLANT
SUT MNCRL 0 MO-4 VIOLET 18 CR (SUTURE) ×9 IMPLANT
SUT MNCRL 0 VIOLET 6X18 (SUTURE) ×3 IMPLANT
SUT MONOCRYL 0 6X18 (SUTURE) ×1
SUT MONOCRYL 0 MO 4 18  CR/8 (SUTURE) ×3
SUT VIC AB 2-0 CT1 27 (SUTURE) ×3
SUT VIC AB 2-0 CT1 TAPERPNT 27 (SUTURE) ×9 IMPLANT
SUT VICRYL 0 UR6 27IN ABS (SUTURE) ×8 IMPLANT
SUT VICRYL RAPIDE 3 0 (SUTURE) ×8 IMPLANT
TOWEL OR 17X24 6PK STRL BLUE (TOWEL DISPOSABLE) ×8 IMPLANT
TRAY FOLEY BAG SILVER LF 14FR (CATHETERS) ×4 IMPLANT
TROCAR BALLN 12MMX100 BLUNT (TROCAR) ×4 IMPLANT
TROCAR XCEL NON-BLD 5MMX100MML (ENDOMECHANICALS) ×8 IMPLANT
WARMER LAPAROSCOPE (MISCELLANEOUS) ×4 IMPLANT
WATER STERILE IRR 1000ML POUR (IV SOLUTION) ×4 IMPLANT

## 2013-02-05 NOTE — OR Nursing (Signed)
Updated the procedure information in the patient position

## 2013-02-05 NOTE — Anesthesia Postprocedure Evaluation (Signed)
  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

## 2013-02-05 NOTE — Anesthesia Postprocedure Evaluation (Signed)
Anesthesia Post Note  Patient: [name]  Procedure(s) Performed: Procedure(s) (LRB): HYSTERECTOMY VAGINAL (N/A) LAPAROSCOPY DIAGNOSTIC (N/A) ABLATION ON ENDOMETRIOSIS (N/A)  Anesthesia type: General  Patient location: [facility]  Post pain: Pain level controlled  Post assessment: Post-op Vital signs reviewed  Last Vitals:  Filed Vitals:   02/05/13 1400  BP: 121/79  Pulse: 55  Temp: 36.3 C  Resp: 18    Post vital signs: Reviewed  Level of consciousness: sedated  Complications: No apparent anesthesia complications

## 2013-02-05 NOTE — Anesthesia Postprocedure Evaluation (Signed)
Anesthesia Post Note  Patient: [name]  Procedure(s) Performed: Procedure(s): HYSTERECTOMY VAGINAL (N/A) LAPAROSCOPY DIAGNOSTIC (N/A) ABLATION ON ENDOMETRIOSIS (N/A)  Anesthesia type: General  Patient location: [facility]  Post pain: Pain level controlled  Post assessment: Post-op Vital signs reviewed  Last Vitals: BP 121/79  Pulse 55  Temp(Src) 36.3 C (Oral)  Resp 18  Ht 5\' 5"  (1.651 m)  Wt 131 lb (59.421 kg)  BMI 21.8 kg/m2  SpO2 99%  Post vital signs: Reviewed  Level of consciousness: awake  Complications: No apparent anesthesia complications

## 2013-02-05 NOTE — H&P (Signed)
Pt is a 42 year old white female who presents to the OR for a LAVH possible laser of endometriosis. She has a 5-6 yr h.o. Worsening pelvic pain, dysmenorrhea, and dysparunia. She has had a dx scope in past for similar complaints. She was offered Lupron, ocps and declined.  PE: VSSAF         HEENT-wnl         CV- RRR without murmur.          ABD- soft, non tender , no masses         Pelvic- ext-wnl                     Vagina-wnl                     cx- poss CMT                     Ut- normal size and shape IMP/ Chronic pelvic pain, dysmenorrhea, dysparunia PLAN/ LAVH, possible laser of endometriosis

## 2013-02-05 NOTE — Transfer of Care (Signed)
Immediate Anesthesia Transfer of Care Note  Patient: [name]  Procedure(s) Performed: Procedure(s): HYSTERECTOMY VAGINAL (N/A) LAPAROSCOPY DIAGNOSTIC (N/A) ABLATION ON ENDOMETRIOSIS (N/A)  Patient Location: PACU  Anesthesia Type:General  Level of Consciousness: awake, alert  and oriented  Airway & Oxygen Therapy: Patient Spontanous Breathing and Patient connected to nasal cannula oxygen  Post-op Assessment: Report given to PACU RN, Post -op Vital signs reviewed and stable and Patient moving all extremities X 4  Post vital signs: Reviewed and stable  Complications: No apparent anesthesia complications

## 2013-02-05 NOTE — Preoperative (Signed)
Beta Blockers   Reason not to administer Beta Blockers:Not Applicable 

## 2013-02-05 NOTE — Anesthesia Preprocedure Evaluation (Signed)

## 2013-02-05 NOTE — Anesthesia Procedure Notes (Addendum)
Procedure Name: Intubation Date/Time: 02/05/2013 7:32 AM Performed by: Harriett Rush, Naresh Althaus A Pre-anesthesia Checklist: Patient identified, Emergency Drugs available, Suction available, Patient being monitored and Timeout performed Patient Re-evaluated:Patient Re-evaluated prior to inductionOxygen Delivery Method: Circle system utilized Preoxygenation: Pre-oxygenation with 100% oxygen Intubation Type: IV induction Ventilation: Mask ventilation without difficulty Laryngoscope Size: Miller and 2 Grade View: Grade I Tube type: Oral Tube size: 6.0 mm Number of attempts: 1 Placement Confirmation: ETT inserted through vocal cords under direct vision,  positive ETCO2 and breath sounds checked- equal and bilateral Secured at: 20 cm Tube secured with: Tape Dental Injury: Teeth and Oropharynx as per pre-operative assessment

## 2013-02-05 NOTE — Op Note (Signed)
[name], [name] NO.:  [id number]  MEDICAL RECORD NO.:  [id number]  LOCATION:  [facility]                          FACILITY:  [facility]  PHYSICIAN:  [name], M.D.    DATE OF BIRTH:  [birth year]  DATE OF PROCEDURE:  02/05/2013 DATE OF DISCHARGE:                              OPERATIVE REPORT   PREOPERATIVE DIAGNOSES: 1. Chronic pelvic pain. 2. History of endometriosis. 3. Dyspareunia. 4. Dysmenorrhea.  POSTOPERATIVE DIAGNOSES: 1. Chronic pelvic pain. 2. History of endometriosis. 3. Dyspareunia. 4. Dysmenorrhea.  PROCEDURES: 1. Diagnostic laparoscopy with cauterization of endometriosis. 2. Total vaginal hysterectomy.  SURGEON:  Dr. Dareen Piano.  ASSISTANT:  Dr.  Lissa Hoard.  ANESTHESIA:  General and local.  ANTIBIOTICS:  Ancef 2 g.  DRAINS:  Foley bedside drainage.  ESTIMATED BLOOD LOSS:  75 mL.  COMPLICATIONS:  None.  SPECIMENS:  Uterus and cervix sent to Pathology.  PROCEDURE IN DETAIL:  The patient was taken to the operating room, where she was placed in a dorsal supine position and general anesthetic was administered without difficulty.  She was then prepped and draped in the usual fashion for this procedure.  Her bladder was drained with a red rubber catheter.  An exam under anesthesia revealed normal size and shaped uterus.  There were no adnexal masses.  A Hulka tenaculum was applied to the anterior cervical lip.  Her umbilicus was then injected with 0.25% Marcaine.  A vertical skin incision was made.  The fascia was grasped and entered with Mayo.  Parietal peritoneum was entered with blunt dissection.  The Hasson cannula placed into the abdominal cavity and 3 L of carbon dioxide was insufflated.  The patient was then placed in Trendelenburg.  A 5-mm port was placed in the suprapubic region under direct visualization.  The patient then had a diagnostic laparoscopy performed.  Both ovaries and tubes were normal.  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  The patient had minimal endometriosis mostly involving uterosacral ligaments.  At this point, the Kleppinger forceps were used to cauterize the areas of endometriosis.  This is when the procedure was converted to a total vaginal hysterectomy.  The instruments and pneumoperitoneum released from the abdomen.  A weighted speculum was placed in the vagina.  A single-tooth tenaculum was placed on the cervix.  A 10 mL of 1% lidocaine with epinephrine was used for paracervical block.  The posterior cul-de-sac was entered sharply. Uterosacral ligaments were bilaterally clamped, cut, and ligated with 0 Monocryl suture.  The cervix was circumscribed and the anterior cul-de- sac was entered with sharp dissection.  The bladder pillars were bilaterally clamped, cut, and ligated with 0 Monocryl suture.  The cardinal ligaments were bilaterally clamped, cut, and ligated with 0 Monocryl suture.  The uterine vessels were bilaterally clamped, cut, and ligated with 0 Monocryl suture.  The uterus was then inverted and the triple pedicle which involved the ovarian ligament, fallopian tube, and the round ligaments were bilateral clamped, cut, and ligated x2 with Monocryl suture.  All pedicles were felt to be hemostatic.  At this point, the posterior cuff was closed using 2-0 Vicryl in a running, locking fashion.  A Foley catheter was placed in the  bladder.  The remaining vaginal cuff was closed vertically using 2-0 Vicryl in a running, locking fashion.  The abdomen was then reinsufflated and the scope placed in the abdomen.  The patient was placed in Trendelenburg. On exam, there is no evidence of any active bleeding of the pedicles over the cuff.  The ovaries appeared to be normal.  The ureters appeared to be functioning bilaterally.  At this point, the pneumoperitoneum was released and the fascia and the umbilicus closed with interrupted 0 Vicryl suture.  The skin incisions were closed using  Dermabond.  The patient was awoke and taken to the recovery room in stable condition. Instrument and lap count was correct x2.          ______________________________ [name], M.D.     MA/MEDQ  D:  02/05/2013  T:  02/05/2013  Job:  [id number]

## 2013-02-06 ENCOUNTER — Encounter (HOSPITAL_COMMUNITY): Payer: Self-pay | Admitting: Obstetrics and Gynecology

## 2013-02-06 LAB — CBC
MCH: 29.4 pg (ref 26.0–34.0)
MCHC: 35.9 g/dL (ref 30.0–36.0)
Platelets: 210 10*3/uL (ref 150–400)
RDW: 13 % (ref 11.5–15.5)

## 2013-02-06 MED ORDER — HYDROMORPHONE HCL 2 MG PO TABS
4.0000 mg | ORAL_TABLET | ORAL | Status: DC | PRN
  Administered 2013-02-06 (×5): 2 mg via ORAL
  Filled 2013-02-06 (×5): qty 1

## 2013-02-06 MED ORDER — HYDROMORPHONE HCL 4 MG PO TABS: 4.0000 mg | ORAL_TABLET | ORAL | Status: DC | PRN

## 2013-02-06 MED ORDER — IBUPROFEN 600 MG PO TABS: 600.0000 mg | ORAL_TABLET | Freq: Four times a day (QID) | ORAL | Status: DC | PRN

## 2013-02-06 MED ORDER — IBUPROFEN 600 MG PO TABS
600.0000 mg | ORAL_TABLET | Freq: Four times a day (QID) | ORAL | Status: DC | PRN
  Administered 2013-02-06 (×2): 600 mg via ORAL
  Filled 2013-02-06 (×2): qty 1

## 2013-02-06 MED ORDER — HYDROMORPHONE HCL 2 MG PO TABS
2.0000 mg | ORAL_TABLET | ORAL | Status: DC | PRN
  Administered 2013-02-06: 2 mg via ORAL
  Filled 2013-02-06: qty 1

## 2013-02-06 MED ORDER — MORPHINE SULFATE 4 MG/ML IJ SOLN
4.0000 mg | Freq: Once | INTRAMUSCULAR | Status: AC
  Administered 2013-02-06: 4 mg via INTRAVENOUS
  Filled 2013-02-06: qty 1

## 2013-02-06 NOTE — Progress Notes (Signed)
Went over discharge instructions and prescription medications. Ambulated patient to car. Husband took patient home.

## 2013-02-06 NOTE — Progress Notes (Signed)
POD#1 Pt had difficulty with pain management this am. Now adequately controlled with Dilaudid and  Motrin. Lab- ok Voiding well. Tolerating diet. VSSAF ABD- soft, non distended.

## 2013-02-06 NOTE — Progress Notes (Signed)
Called by nurse at 2000h on 9/23 and asked to d/c PCA.  I was told that patient's pain was well controlled and that she was disturbed by the sound of the PCA pump.  I was called again at 0030 and informed that the patient wanted IV pain medication and she also wanted her Foley removed.  I was also informed that the patient had taken 2 Percocet at around 0025.  I felt uncomfortable ordering IV pain medication only a few minutes after the administration of 10 mg of oral oxycodone.  I told the nurse that she could remove the Foley and to wait to see if the Percocet dose would relieve the patient's pain.

## 2013-02-13 NOTE — Discharge Summary (Signed)
[name], [name] NO.:  [id number]  MEDICAL RECORD NO.:  [id number]  LOCATION:  9309                          FACILITY:  [facility]  PHYSICIAN:  Malva Limes, M.D.    DATE OF BIRTH:  1971/01/29  DATE OF ADMISSION:  02/05/2013 DATE OF DISCHARGE:  02/06/2013                              DISCHARGE SUMMARY   PRINCIPAL DISCHARGE DIAGNOSES: 1. Chronic pelvic pain. 2. Dyspareunia. 3. Dysmenorrhea. 4. History of endometriosis.  PRINCIPAL PROCEDURES:  Laparoscopic-assisted vaginal hysterectomy.  HISTORY OF PRESENT ILLNESS:  [name] is a 42 year old, white female, who presented to the [facility] OR on February 05, 2013, for laparoscopic-assisted vaginal hysterectomy with possible laser of endometriosis.  The patient had a 5- to 6-year history of worsening pelvic pain, dysmenorrhea, and dyspareunia.  She did have a diagnostic scope in the past for similar complaints at which time endometriosis was identified and treated with cautery.  The patient was offered treatment with oral contraceptive pills, Lupron and declined.  HOSPITAL COURSE:  The patient underwent a laparoscopic-assisted vaginal hysterectomy without complications.  A complete description of this can be found in the dictated operative note.  The patient's postop course was benign.  However, she did have some difficulty with pain management. The patient was discharged to home on postoperative day #1.  She was sent home with Dilaudid and Motrin.  She was instructed to follow up in the office in 4 weeks.  Her pathology was pending at the time of discharge.  At the time of discharge, she was eating a regular diet. Her hemoglobin was stable.  Her vital signs were normal.  She had no vaginal bleeding.          ______________________________ Malva Limes, M.D.     MA/MEDQ  D:  02/12/2013  T:  02/13/2013  Job:  [id number]

## 2013-08-22 ENCOUNTER — Other Ambulatory Visit: Payer: Self-pay | Admitting: Obstetrics and Gynecology

## 2013-09-04 ENCOUNTER — Other Ambulatory Visit: Payer: Self-pay | Admitting: Obstetrics and Gynecology

## 2013-09-11 ENCOUNTER — Encounter (HOSPITAL_COMMUNITY): Payer: Self-pay

## 2013-09-26 ENCOUNTER — Encounter (HOSPITAL_COMMUNITY): Payer: Self-pay | Admitting: *Deleted

## 2013-09-26 ENCOUNTER — Encounter (HOSPITAL_COMMUNITY)
Admission: RE | Disposition: A | Discharge status: Home / Self Care | Payer: Self-pay | Source: Ambulatory Visit | Attending: Obstetrics and Gynecology

## 2013-09-26 ENCOUNTER — Encounter (HOSPITAL_COMMUNITY): Payer: BC Managed Care – PPO | Admitting: Anesthesiology

## 2013-09-26 ENCOUNTER — Ambulatory Visit (HOSPITAL_COMMUNITY)
Admission: RE | Admit: 2013-09-26 | Discharge: 2013-09-26 | Disposition: A | Discharge status: Home / Self Care | Payer: BC Managed Care – PPO | Source: Ambulatory Visit | Attending: Obstetrics and Gynecology | Admitting: Obstetrics and Gynecology

## 2013-09-26 ENCOUNTER — Ambulatory Visit (HOSPITAL_COMMUNITY): Payer: BC Managed Care – PPO | Admitting: Anesthesiology

## 2013-09-26 DIAGNOSIS — E0500 Thyrotoxicosis with diffuse goiter without thyrotoxic crisis or storm: Secondary | ICD-10-CM | POA: Insufficient documentation

## 2013-09-26 DIAGNOSIS — N834 Prolapse and hernia of ovary and fallopian tube, unspecified side: Secondary | ICD-10-CM | POA: Insufficient documentation

## 2013-09-26 DIAGNOSIS — N930 Postcoital and contact bleeding: Secondary | ICD-10-CM | POA: Insufficient documentation

## 2013-09-26 DIAGNOSIS — E039 Hypothyroidism, unspecified: Secondary | ICD-10-CM | POA: Insufficient documentation

## 2013-09-26 HISTORY — PX: ANTERIOR AND POSTERIOR REPAIR: SHX5121

## 2013-09-26 LAB — CBC
HCT: 38.2 % (ref 36.0–46.0)
Hemoglobin: 13.7 g/dL (ref 12.0–15.0)
MCH: 30 pg (ref 26.0–34.0)
MCHC: 35.9 g/dL (ref 30.0–36.0)
MCV: 83.8 fL (ref 78.0–100.0)
PLATELETS: 244 10*3/uL (ref 150–400)
RBC: 4.56 MIL/uL (ref 3.87–5.11)
RDW: 12.9 % (ref 11.5–15.5)
WBC: 9.2 10*3/uL (ref 4.0–10.5)

## 2013-09-26 SURGERY — ANTERIOR (CYSTOCELE) AND POSTERIOR REPAIR (RECTOCELE)
Anesthesia: General | Site: Vagina

## 2013-09-26 MED ORDER — LIDOCAINE-EPINEPHRINE 1 %-1:100000 IJ SOLN
INTRAMUSCULAR | Status: DC | PRN
  Administered 2013-09-26: 6 mL
  Administered 2013-09-26: 2 mL

## 2013-09-26 MED ORDER — CEFAZOLIN SODIUM-DEXTROSE 2-3 GM-% IV SOLR
2.0000 g | INTRAVENOUS | Status: AC
  Administered 2013-09-26: 2 g via INTRAVENOUS

## 2013-09-26 MED ORDER — LACTATED RINGERS IV SOLN
INTRAVENOUS | Status: DC
  Administered 2013-09-26: 11:00:00 via INTRAVENOUS

## 2013-09-26 MED ORDER — ONDANSETRON HCL 4 MG/2ML IJ SOLN
INTRAMUSCULAR | Status: AC
  Filled 2013-09-26: qty 2

## 2013-09-26 MED ORDER — FENTANYL CITRATE 0.05 MG/ML IJ SOLN
INTRAMUSCULAR | Status: AC
  Filled 2013-09-26: qty 5

## 2013-09-26 MED ORDER — LACTATED RINGERS IV SOLN: INTRAVENOUS | Status: DC

## 2013-09-26 MED ORDER — DEXAMETHASONE SODIUM PHOSPHATE 10 MG/ML IJ SOLN
INTRAMUSCULAR | Status: AC
  Filled 2013-09-26: qty 1

## 2013-09-26 MED ORDER — KETOROLAC TROMETHAMINE 30 MG/ML IJ SOLN
INTRAMUSCULAR | Status: AC
  Filled 2013-09-26: qty 2

## 2013-09-26 MED ORDER — MIDAZOLAM HCL 2 MG/2ML IJ SOLN
INTRAMUSCULAR | Status: AC
  Filled 2013-09-26: qty 2

## 2013-09-26 MED ORDER — ONDANSETRON HCL 4 MG/2ML IJ SOLN
INTRAMUSCULAR | Status: DC | PRN
  Administered 2013-09-26: 4 mg via INTRAVENOUS

## 2013-09-26 MED ORDER — NEOSTIGMINE METHYLSULFATE 10 MG/10ML IV SOLN
INTRAVENOUS | Status: AC
  Filled 2013-09-26: qty 1

## 2013-09-26 MED ORDER — PROPOFOL 10 MG/ML IV EMUL
INTRAVENOUS | Status: AC
  Filled 2013-09-26: qty 20

## 2013-09-26 MED ORDER — ROCURONIUM BROMIDE 100 MG/10ML IV SOLN
INTRAVENOUS | Status: AC
  Filled 2013-09-26: qty 1

## 2013-09-26 MED ORDER — GLYCOPYRROLATE 0.2 MG/ML IJ SOLN
INTRAMUSCULAR | Status: AC
  Filled 2013-09-26: qty 3

## 2013-09-26 MED ORDER — CEFAZOLIN SODIUM-DEXTROSE 2-3 GM-% IV SOLR
INTRAVENOUS | Status: AC
  Filled 2013-09-26: qty 50

## 2013-09-26 MED ORDER — DEXAMETHASONE SODIUM PHOSPHATE 10 MG/ML IJ SOLN
INTRAMUSCULAR | Status: DC | PRN
  Administered 2013-09-26: 10 mg via INTRAVENOUS

## 2013-09-26 MED ORDER — FENTANYL CITRATE 0.05 MG/ML IJ SOLN: 25.0000 ug | INTRAMUSCULAR | Status: DC | PRN

## 2013-09-26 MED ORDER — LIDOCAINE HCL (CARDIAC) 20 MG/ML IV SOLN
INTRAVENOUS | Status: DC | PRN
  Administered 2013-09-26: 80 mg via INTRAVENOUS

## 2013-09-26 MED ORDER — FENTANYL CITRATE 0.05 MG/ML IJ SOLN
INTRAMUSCULAR | Status: DC | PRN
  Administered 2013-09-26: 100 ug via INTRAVENOUS
  Administered 2013-09-26 (×3): 50 ug via INTRAVENOUS

## 2013-09-26 MED ORDER — OXYCODONE-ACETAMINOPHEN 5-325 MG PO TABS
ORAL_TABLET | ORAL | Status: AC
  Administered 2013-09-26: 1 via ORAL
  Filled 2013-09-26: qty 1

## 2013-09-26 MED ORDER — OXYCODONE-ACETAMINOPHEN 10-325 MG PO TABS: 1.0000 | ORAL_TABLET | ORAL | Status: DC | PRN

## 2013-09-26 MED ORDER — LIDOCAINE-EPINEPHRINE 1 %-1:100000 IJ SOLN
INTRAMUSCULAR | Status: AC
  Filled 2013-09-26: qty 1

## 2013-09-26 MED ORDER — LIDOCAINE HCL (CARDIAC) 20 MG/ML IV SOLN
INTRAVENOUS | Status: AC
  Filled 2013-09-26: qty 5

## 2013-09-26 MED ORDER — LIDOCAINE HCL (CARDIAC) 20 MG/ML IV SOLN
INTRAVENOUS | Status: AC
Start: 2013-09-26 — End: 2013-09-26
  Filled 2013-09-26: qty 5

## 2013-09-26 MED ORDER — OXYCODONE-ACETAMINOPHEN 5-325 MG PO TABS
1.0000 | ORAL_TABLET | ORAL | Status: DC | PRN
  Administered 2013-09-26: 1 via ORAL

## 2013-09-26 MED ORDER — PROPOFOL 10 MG/ML IV BOLUS
INTRAVENOUS | Status: DC | PRN
  Administered 2013-09-26: 180 mg via INTRAVENOUS

## 2013-09-26 MED ORDER — ESTRADIOL 0.1 MG/GM VA CREA
TOPICAL_CREAM | VAGINAL | Status: DC | PRN
  Administered 2013-09-26: 1 via VAGINAL

## 2013-09-26 MED ORDER — 0.9 % SODIUM CHLORIDE (POUR BTL) OPTIME
TOPICAL | Status: DC | PRN
  Administered 2013-09-26: 1000 mL

## 2013-09-26 MED ORDER — MIDAZOLAM HCL 5 MG/5ML IJ SOLN
INTRAMUSCULAR | Status: DC | PRN
  Administered 2013-09-26: 2 mg via INTRAVENOUS

## 2013-09-26 MED ORDER — ESTRADIOL 0.1 MG/GM VA CREA
TOPICAL_CREAM | VAGINAL | Status: AC
  Filled 2013-09-26: qty 42.5

## 2013-09-26 SURGICAL SUPPLY — 22 items
CANISTER SUCT 3000ML (MISCELLANEOUS) ×3 IMPLANT
CATH BONANNO SUPRAPUBIC 14G (CATHETERS) IMPLANT
CATH ROBINSON RED A/P 16FR (CATHETERS) IMPLANT
CLOTH BEACON ORANGE TIMEOUT ST (SAFETY) ×3 IMPLANT
CONT PATH 16OZ SNAP LID 3702 (MISCELLANEOUS) IMPLANT
DECANTER SPIKE VIAL GLASS SM (MISCELLANEOUS) ×3 IMPLANT
GAUZE PACKING 2X5 YD STRL (GAUZE/BANDAGES/DRESSINGS) IMPLANT
GAUZE PACKING IODOFORM 2 (PACKING) IMPLANT
GLOVE ECLIPSE 7.0 STRL STRAW (GLOVE) ×6 IMPLANT
GOWN STRL REUS W/TWL LRG LVL3 (GOWN DISPOSABLE) ×12 IMPLANT
NEEDLE HYPO 22GX1.5 SAFETY (NEEDLE) ×3 IMPLANT
NS IRRIG 1000ML POUR BTL (IV SOLUTION) ×3 IMPLANT
PACK VAGINAL WOMENS (CUSTOM PROCEDURE TRAY) ×3 IMPLANT
SUT VIC AB 0 CT1 18XCR BRD8 (SUTURE) ×1 IMPLANT
SUT VIC AB 0 CT1 8-18 (SUTURE) ×2
SUT VIC AB 2-0 UR5 27 (SUTURE) IMPLANT
SUT VIC AB 2-0 UR6 27 (SUTURE) ×3 IMPLANT
SUT VICRYL 0 UR6 27IN ABS (SUTURE) ×9 IMPLANT
SUT VICRYL RAPIDE 3 0 (SUTURE) IMPLANT
TOWEL OR 17X24 6PK STRL BLUE (TOWEL DISPOSABLE) ×6 IMPLANT
TRAY FOLEY CATH 14FR (SET/KITS/TRAYS/PACK) IMPLANT
WATER STERILE IRR 1000ML POUR (IV SOLUTION) IMPLANT

## 2013-09-26 NOTE — Transfer of Care (Signed)
Immediate Anesthesia Transfer of Care Note  Patient: [name]  Procedure(s) Performed: Procedure(s) with comments: EXCISION OF PROLAPSED FALLOPIAN TUBES/ VAGINAL CUFF REPAIR. (N/A) - EXCISION OF PROLAPSED FALLOPIAN TUBE / VAGINAL CUFF REPAIR.  Patient Location: PACU  Anesthesia Type:General  Level of Consciousness: awake, alert  and oriented  Airway & Oxygen Therapy: Patient Spontanous Breathing and Patient connected to nasal cannula oxygen  Post-op Assessment: Report given to PACU RN and Post -op Vital signs reviewed and stable  Post vital signs: Reviewed and stable  Complications: No apparent anesthesia complications

## 2013-09-26 NOTE — Anesthesia Procedure Notes (Signed)
Procedure Name: LMA Insertion Date/Time: 09/26/2013 11:42 AM Performed by: Flossie Dibble Pre-anesthesia Checklist: Patient identified, Timeout performed, Emergency Drugs available, Patient being monitored and Suction available Patient Re-evaluated:Patient Re-evaluated prior to inductionOxygen Delivery Method: Circle system utilized Preoxygenation: Pre-oxygenation with 100% oxygen Intubation Type: IV induction Ventilation: Mask ventilation without difficulty LMA: LMA inserted LMA Size: 4.0 Number of attempts: 1 Placement Confirmation: breath sounds checked- equal and bilateral and positive ETCO2 Tube secured with: Tape Dental Injury: Teeth and Oropharynx as per pre-operative assessment

## 2013-09-26 NOTE — H&P (Signed)
[name] is an 43 y.o. white female s/p TVH who c/o post coital bleeding and some RLQ pain at times. She is noted to have a prolapsed fallopian tube . She is here for excision and revision of cuff. Chief Complaint: HPI:  Past Medical History  Diagnosis Date  . Thyroid disease   . Fibromyalgia 2008  . Hypothyroidism     Past Surgical History  Procedure Laterality Date  . Tonsillectomy    . Abdominal hysterectomy    . Laparoscopy abdomen diagnostic    . Vaginal hysterectomy N/A 02/05/2013    Procedure: HYSTERECTOMY VAGINAL;  Surgeon: Olga Millers, MD;  Location: [facility];  Service: Gynecology;  Laterality: N/A;  . Laparoscopy N/A 02/05/2013    Procedure: LAPAROSCOPY DIAGNOSTIC;  Surgeon: Olga Millers, MD;  Location: [facility];  Service: Gynecology;  Laterality: N/A;  . Ablation on endometriosis N/A 02/05/2013    Procedure: ABLATION ON ENDOMETRIOSIS;  Surgeon: Olga Millers, MD;  Location: [facility];  Service: Gynecology;  Laterality: N/A;    Family History  Problem Relation Age of Onset  . Arthritis Mother   . Cancer Father   . Hypertension Father    Social History:  reports that she has never smoked. She has never used smokeless tobacco. She reports that she drinks about 1.2 ounces of alcohol per week. She reports that she does not use illicit drugs.  Allergi Blood pressure 121/71, pulse 66, temperature 98.1 F (36.7 C), temperature source Oral, resp. rate 20, height 5\' 5"  (1.651 m), weight 63.504 kg (140 lb), SpO2 100.00%. BP 121/71  Pulse 66  Temp(Src) 98.1 F (36.7 C) (Oral)  Resp 20  Ht 5\' 5"  (1.651 m)  Wt 63.504 kg (140 lb)  BMI 23.30 kg/m2  SpO2 100% Lungs: clear to auscultation bilaterally Abdomen: soft, non-tender; bowel sounds normal; no masses,  no organomegaly Pelvic: external genitalia normal, no adnexal masses or tenderness, rectovaginal septum normal and 1-2 cm fallopian tube at cuff.   Lab Results  Component Value Date   WBC 9.2 09/26/2013   HGB  13.7 09/26/2013   HCT 38.2 09/26/2013   MCV 83.8 09/26/2013   PLT 244 09/26/2013   Lab Results  Component Value Date   PREGTESTUR NEGATIVE 02/05/2013     Assessment/Plan   Patient Active Problem List   Diagnosis Date Noted  . Lumbar radiculitis 11/15/2010  . Numbness in both legs 11/15/2010  . Hypothyroidism 09/23/2010  . Fibromyalgia 09/23/2010   Imp/ prolapsed fallopian tube Plan/ Excise FT and revise cuff.  Olga Millers 09/26/2013, 11:24 AM

## 2013-09-26 NOTE — Anesthesia Preprocedure Evaluation (Signed)
Anesthesia Evaluation  Patient identified by MRN, date of birth, ID band Patient awake    Reviewed: Allergy & Precautions, H&P , Patient's Chart, lab work & pertinent test results, reviewed documented beta blocker date and time   Airway Mallampati: II TM Distance: >3 FB Neck ROM: full    Dental no notable dental hx.    Pulmonary  breath sounds clear to auscultation  Pulmonary exam normal       Cardiovascular Rhythm:regular Rate:Normal     Neuro/Psych    GI/Hepatic   Endo/Other  Hypothyroidism   Renal/GU      Musculoskeletal   Abdominal   Peds  Hematology   Anesthesia Other Findings   Reproductive/Obstetrics                           Anesthesia Physical Anesthesia Plan  ASA: II  Anesthesia Plan:    Post-op Pain Management:    Induction: Intravenous  Airway Management Planned: LMA  Additional Equipment:   Intra-op Plan:   Post-operative Plan:   Informed Consent: I have reviewed the patients History and Physical, chart, labs and discussed the procedure including the risks, benefits and alternatives for the proposed anesthesia with the patient or authorized representative who has indicated his/her understanding and acceptance.   Dental Advisory Given and Dental advisory given  Plan Discussed with: CRNA and Surgeon  Anesthesia Plan Comments: (Discussed GA with LMA, possible sore throat, potential need to switch to ETT, N/V, pulmonary aspiration. Questions answered. )        Anesthesia Quick Evaluation

## 2013-09-26 NOTE — Anesthesia Postprocedure Evaluation (Signed)
Anesthesia Post Note  Patient: [name]  Procedure(s) Performed: Procedure(s) (LRB): EXCISION OF PROLAPSED FALLOPIAN TUBES/ VAGINAL CUFF REPAIR. (N/A)  Anesthesia type: GA  Patient location: PACU  Post pain: Pain level controlled  Post assessment: Post-op Vital signs reviewed  Last Vitals:  Filed Vitals:   09/26/13 1315  BP: 103/65  Pulse: 58  Temp:   Resp: 24    Post vital signs: Reviewed  Level of consciousness: sedated  Complications: No apparent anesthesia complications

## 2013-09-27 ENCOUNTER — Encounter (HOSPITAL_COMMUNITY): Payer: Self-pay | Admitting: Obstetrics and Gynecology

## 2013-09-27 NOTE — Op Note (Signed)
NAMENAIOMY, WATTERS NO.:  [id number]  MEDICAL RECORD NO.:  [id number]  LOCATION:  [facility]                          FACILITY:  [facility]  PHYSICIAN:  Freda Munro, M.D.    DATE OF BIRTH:  16-Dec-1970  DATE OF PROCEDURE:  09/26/2013 DATE OF DISCHARGE:  09/26/2013                              OPERATIVE REPORT   PREOPERATIVE DIAGNOSIS:  Prolapsed fallopian tubes.  POSTOPERATIVE DIAGNOSIS:  Prolapsed fallopian tubes.  PROCEDURES: 1. Excision of prolapse of fallopian tube. 2. Revision of vaginal cuff.  SURGEON:  Freda Munro, M.D.  ANESTHESIA:  General and local.  ANTIBIOTICS:  Ancef 2 g IV.  ESTIMATED BLOOD LOSS:  Minimal.  COMPLICATIONS:  None.  PROCEDURE IN DETAIL:  The patient was taken to the operating room where she was placed in dorsal supine position.  A general anesthetic was administered without difficulty.  She was then placed in dorsal lithotomy position.  She was prepped and draped in the usual fashion for this procedure.  Her bladder was drained with the red rubber catheter. At that point, a weighted speculum was placed in the vagina.  She was noted to have two areas of prolapsed fallopian tube on the superior and inferior aspect of the vaginal cuff.  The largest being on the superior aspect, was approximately 2 cm.  This was a friable bulbous mass.  The area around the cuff where this protruded was injected with 1% lidocaine with epinephrine.  The fistula where this passed through was minute of a size of a pin, this area was excised and oversewn, and then the vaginal cuff closed with interrupted 2-0 Vicryl suture.  The similar procedure was performed on the inferior mass, that mass was approximately 1 cm in size.  At this point, there was no evidence of any bleeding.  The patient was awoke and taken to the recovery room in stable condition.  Instrument and lap counts were correct x2.  She will be discharged to home.  She will follow up in the  office in 4 weeks.  She was sent home with Percocet to take p.r.n.          ______________________________ Freda Munro, M.D.     MA/MEDQ  D:  09/26/2013  T:  09/27/2013  Job:  [id number]

## 2015-02-25 ENCOUNTER — Other Ambulatory Visit: Payer: Self-pay | Admitting: Obstetrics and Gynecology

## 2015-02-26 LAB — CYTOLOGY - PAP

## 2015-03-11 ENCOUNTER — Other Ambulatory Visit: Payer: Self-pay | Admitting: Physician Assistant

## 2015-03-11 ENCOUNTER — Ambulatory Visit
Admission: RE | Admit: 2015-03-11 | Discharge: 2015-03-11 | Disposition: A | Discharge status: Home / Self Care | Payer: BLUE CROSS/BLUE SHIELD | Source: Ambulatory Visit | Attending: Physician Assistant | Admitting: Physician Assistant

## 2015-03-11 DIAGNOSIS — K5900 Constipation, unspecified: Secondary | ICD-10-CM

## 2015-11-23 ENCOUNTER — Other Ambulatory Visit: Payer: Self-pay | Admitting: Physical Medicine and Rehabilitation

## 2015-11-23 DIAGNOSIS — M5416 Radiculopathy, lumbar region: Secondary | ICD-10-CM

## 2015-11-25 ENCOUNTER — Ambulatory Visit: Payer: BLUE CROSS/BLUE SHIELD | Admitting: Physical Therapy

## 2015-12-01 ENCOUNTER — Ambulatory Visit: Payer: BLUE CROSS/BLUE SHIELD | Admitting: Physical Therapy

## 2015-12-03 ENCOUNTER — Ambulatory Visit: Payer: BLUE CROSS/BLUE SHIELD | Admitting: Physical Therapy

## 2015-12-04 ENCOUNTER — Ambulatory Visit
Admission: RE | Admit: 2015-12-04 | Discharge: 2015-12-04 | Disposition: A | Discharge status: Home / Self Care | Payer: BLUE CROSS/BLUE SHIELD | Source: Ambulatory Visit | Attending: Physical Medicine and Rehabilitation | Admitting: Physical Medicine and Rehabilitation

## 2015-12-04 DIAGNOSIS — M5127 Other intervertebral disc displacement, lumbosacral region: Secondary | ICD-10-CM | POA: Insufficient documentation

## 2015-12-04 DIAGNOSIS — M5416 Radiculopathy, lumbar region: Secondary | ICD-10-CM | POA: Diagnosis present

## 2015-12-08 ENCOUNTER — Ambulatory Visit: Payer: BLUE CROSS/BLUE SHIELD | Admitting: Physical Therapy

## 2015-12-10 ENCOUNTER — Ambulatory Visit: Payer: BLUE CROSS/BLUE SHIELD | Admitting: Physical Therapy

## 2015-12-15 ENCOUNTER — Ambulatory Visit: Payer: BLUE CROSS/BLUE SHIELD | Admitting: Physical Therapy

## 2015-12-17 ENCOUNTER — Ambulatory Visit: Payer: BLUE CROSS/BLUE SHIELD | Admitting: Physical Therapy

## 2015-12-22 ENCOUNTER — Ambulatory Visit: Payer: BLUE CROSS/BLUE SHIELD | Admitting: Physical Therapy

## 2015-12-24 ENCOUNTER — Ambulatory Visit: Payer: BLUE CROSS/BLUE SHIELD | Admitting: Physical Therapy

## 2015-12-29 ENCOUNTER — Ambulatory Visit: Payer: BLUE CROSS/BLUE SHIELD | Admitting: Physical Therapy

## 2015-12-31 ENCOUNTER — Ambulatory Visit: Payer: BLUE CROSS/BLUE SHIELD | Admitting: Physical Therapy

## 2016-01-06 ENCOUNTER — Encounter: Payer: BLUE CROSS/BLUE SHIELD | Admitting: Physical Therapy

## 2016-01-20 ENCOUNTER — Ambulatory Visit: Payer: BLUE CROSS/BLUE SHIELD | Attending: Physical Medicine and Rehabilitation | Admitting: Physical Therapy

## 2016-01-20 ENCOUNTER — Encounter: Payer: Self-pay | Admitting: Physical Therapy

## 2016-01-20 DIAGNOSIS — R262 Difficulty in walking, not elsewhere classified: Secondary | ICD-10-CM | POA: Insufficient documentation

## 2016-01-20 DIAGNOSIS — M25551 Pain in right hip: Secondary | ICD-10-CM | POA: Diagnosis present

## 2016-01-20 DIAGNOSIS — M545 Low back pain, unspecified: Secondary | ICD-10-CM

## 2016-01-20 DIAGNOSIS — M6281 Muscle weakness (generalized): Secondary | ICD-10-CM | POA: Insufficient documentation

## 2016-01-20 DIAGNOSIS — M25552 Pain in left hip: Secondary | ICD-10-CM | POA: Diagnosis present

## 2016-01-20 NOTE — Therapy (Signed)
[place] MAIN [facility] SERVICES [address] [place], [place], [address] Phone: [phone]   Fax:  [phone]  Physical Therapy Evaluation  Patient Details  Name: [name] MRN: [id number] Date of Birth: 1970/05/21 Referring Provider: Sharlet Salina, MD  Encounter Date: 01/20/2016      PT End of Session - 01/20/16 1640    Visit Number 1   Number of Visits 13   Date for PT Re-Evaluation 03/02/16   PT Start Time [id number]   PT Stop Time 1630   PT Time Calculation (min) 60 min   Activity Tolerance Patient tolerated treatment well;No increased pain   Behavior During Therapy WFL for tasks assessed/performed      Past Medical History:  Diagnosis Date  . Anxiety    under control   . Depression    under control  . Fibromyalgia 2008  . Hypothyroidism   . Thyroid disease     Past Surgical History:  Procedure Laterality Date  . ABDOMINAL HYSTERECTOMY    . ABLATION ON ENDOMETRIOSIS N/A 02/05/2013   Procedure: ABLATION ON ENDOMETRIOSIS;  Surgeon: Olga Millers, MD;  Location: [facility];  Service: Gynecology;  Laterality: N/A;  . ANTERIOR AND POSTERIOR REPAIR N/A 09/26/2013   Procedure: EXCISION OF PROLAPSED FALLOPIAN TUBES/ VAGINAL CUFF REPAIR.;  Surgeon: Olga Millers, MD;  Location: [facility];  Service: Gynecology;  Laterality: N/A;  EXCISION OF PROLAPSED FALLOPIAN TUBE / VAGINAL CUFF REPAIR.  Marland Kitchen LAPAROSCOPY N/A 02/05/2013   Procedure: LAPAROSCOPY DIAGNOSTIC;  Surgeon: Olga Millers, MD;  Location: [facility];  Service: Gynecology;  Laterality: N/A;  . LAPAROSCOPY ABDOMEN DIAGNOSTIC    . TONSILLECTOMY    . VAGINAL HYSTERECTOMY N/A 02/05/2013   Procedure: HYSTERECTOMY VAGINAL;  Surgeon: Olga Millers, MD;  Location: [facility];  Service: Gynecology;  Laterality: N/A;    There were no vitals filed for this visit.       Subjective Assessment - 01/20/16 1536    Subjective Pt is 45 year old woman presenting to physical therapy with bilateral  hip and low back pain.  Pt reports that she has fibromyalgia but the bilateral hip/low back pain has become increasingly worse in the last year.  She is also aware of her small L5-S1 disc bulge.  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 She wishes to be active but reports that walking on her treadmill or getting on her eliptical feels fine during the activity but reports she feels much worse the next day.  She reports lifting, sleeping on her side and walking are extremely difficulty with her pain and that she rates her pain fairly constant at a 8/10.  She has not been able to find any relieving factors thus far.  MD referred her for aquatic therapy and further evalaution.     Pertinent History Personal factors: PLOF, work at home, fibromyalgia, chronicity    Limitations Lifting;Standing;Walking   How long can you sit comfortably? less than 5 minutes     How long can you stand comfortably? 5 minutes    How long can you walk comfortably? 5 minutes    Diagnostic tests 12/04/15-MRI:Small L5-S1 disc bulge with mild left-sided foraminal narrowing.   Patient Stated Goals get rid of pain, be able to sleep on side, carry items without feeling like leg is giving out,    Currently in Pain? Yes   Pain Score 8  Pain Location Hip   Pain Orientation Left;Right   Pain Descriptors / Indicators Burning;Aching   Pain Type Chronic pain   Pain Radiating Towards burning sensation gets worse throughout the day, L leg intermittent numbness that radiates all the way to L foot    Pain Onset Other (comment)  over a year    Pain Frequency Constant   Aggravating Factors  standing, sleeping on either hip, carrying anything of any weight    Pain Relieving Factors n/a    Multiple Pain Sites Yes   Pain Score 8   Pain Location Back   Pain Orientation Lower   Pain Descriptors / Indicators Stabbing   Pain Type Chronic pain   Pain Onset Other (comment)   greater than a year    Pain Frequency Constant            [facility] PT Assessment - 01/20/16 0001      Assessment   Medical Diagnosis Fibromyalgia    Referring Provider Sharlet Salina, MD   Onset Date/Surgical Date 01/15/15   Hand Dominance Right   Next MD Visit 02/08/16   Prior Therapy n/a      Precautions   Precautions Fall     Restrictions   Weight Bearing Restrictions No     Balance Screen   Has the patient fallen in the past 6 months Yes   How many times? approximately 4   carrying items, L LE feels like it gives out when its numb   Has the patient had a decrease in activity level because of a fear of falling?  Yes   Is the patient reluctant to leave their home because of a fear of falling?  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    Leisure takes care of her animals  cows, horse, donkey, Microbiologist   Overall Cognitive Status Within Functional Limits for tasks assessed     Observation/Other Assessments   Observations pleasant woman, motivated,       Sensation   Light Touch Impaired by gross assessment  LLE slightly less response than RLE, following no pattern     Posture/Postural Control   Posture/Postural Control Postural limitations   Postural Limitations Increased lumbar lordosis;Rounded Shoulders;Flexed trunk     AROM   Overall AROM Comments lumbar flexion, extension, sidebend WFL in all directions, painful in all directions     Strength   Right Hip Flexion 4-/5   Right Hip ABduction 4-/5   Left Hip Flexion 4-/5   Left Hip ABduction 4-/5   Right Knee Flexion 4/5   Right Knee Extension 4/5   Left Knee Flexion 4/5   Left Knee Extension 4+/5   Right Ankle Dorsiflexion 5/5   Left  Ankle Dorsiflexion 4+/5  occasional flare up of plantar fasciitis      Palpation   Spinal mobility hypomobility noted over L1-L5 with grade 1-2 mobs    SI assessment  painful with grade 3 mobilization   Palpation comment tender to palpation at bilateral piriformis, lumbar paraspinals and bilateral IT band     Special Tests    Special Tests Lumbar   Lumbar  Tests Prone Knee Bend Test     Prone Knee Bend Test   Findings Positive   Side --  bilateral, LBP reproduced with both      Straight Leg Raise   Findings Negative   Side  --  bilaterally, pulling sensation increased >60 degrees     Transfers   Five time sit to stand comments  13 seconds without use of UEs, increased LBP, bilateral hip pain      Ambulation/Gait   Gait Comments ambulates without AD, mild hip hike on RLE, and decreased weightbearing on LLE, decreased arm swing, foward flexed posture      Standardized Balance Assessment   10 Meter Walk 1.65m/s indicating full community ambulator, decreased weight bearing on LLE       Treatment Advised and pt performed piriformis stretch in supine and seated position to bilateral LEs, 2 bouts x 20 seconds each, min VCs to not increase stretch into pain and support low back with opposite knee in hooklying position Advised and pt performed AAROM IT band stretch bilaterally in supine, 2 bouts x 20 seconds, min VCs to keep knee extended throughout                       PT Education - 01/20/16 1639    Education provided Yes   Education Details piriformis and IT band stretch, lumbar roll, intermittent cardio    Person(s) Educated Patient   Methods Explanation;Demonstration;Verbal cues   Comprehension Verbalized understanding;Returned demonstration;Verbal cues required             PT Long Term Goals - 01/20/16 1701      PT LONG TERM GOAL #1   Title Pt will become independent with HEP at home to increase functional independence with all ADLS.    Time 6    Period Weeks   Status New     PT LONG TERM GOAL #2   Title Pt will self report worse pain of 3/10 in low back/bilateral hips in order to improve tolerance and promote greater functional mobility.     Time 6   Period Weeks   Status New     PT LONG TERM GOAL #3   Title Pt will demonstrate grossly 4+/5 strength in RLE/LLE for greater gait mechanics and more functional mobility      Time 6   Period Weeks   Status New     PT LONG TERM GOAL #4   Title Pt will be able to sleep on either side for the entire night without being woken up by pain.     Time 6   Period Weeks   Status New     PT LONG TERM GOAL #5   Title Pt will be able to carry a water pale to her animals without the feeling of L leg giving out or increased back pain.     Time 6   Period Weeks   Status New               Plan - 01/20/16 1710    Clinical Impression Statement Pt presents to physical therapy today with low back and bilateral hip pain that gotten increasingly worse over the last year.  Pt presents with an overall decreased activity level due to pain and inability to sleep on her side at night.  She reports she has had 4+ falls in the last six months due the the feeling of her LLE "giving out".  Pt demonstrates 4/5  gross LE strength.  Pt reports constant 8/10 in bilateral hips and low back and reports no relieving factors.  She had some decrease in sensation in LLE compared to RLE.  Pt was noted to be hypomobile in lumbar spine and demonstrated increase tightness in bilateral pirformis and IT band.  5 time sit to stand was performed in 13 seconds but aggravated symptoms as well as 10 m walk test which was performed in 1.14m/s indicating full community ambulator.  Prone knee bend test was positive for increasingly low back pain in both legs. Pt ambulates with slight limp, increased trunk flexion and decreased weight bearing through LLE. She would benefit from aquatic therapy for several weeks to work towards  increasing activity with decreased weight bearing through BLEs to gain more functional mobility.     Rehab Potential Fair   Clinical Impairments Affecting Rehab Potential Positive: age, motivated, active home lifestyle    Negative: bulging disc, chroncity, fibromyalgia, decreased acitvity level, falls risk    clinical presentation:evolving    PT Frequency 2x / week   PT Duration 6 weeks   PT Treatment/Interventions Neuromuscular re-education;Aquatic Therapy;Cryotherapy;Traction;Ultrasound;Balance training;Therapeutic exercise;Therapeutic activities;Functional mobility training;Stair training;Gait training;Patient/family education;Energy conservation   PT Next Visit Plan aquatic therapy, review IT band and piriformis stretch    PT Home Exercise Plan see pt instructions    Consulted and Agree with Plan of Care Patient      Patient will benefit from skilled therapeutic intervention in order to improve the following deficits and impairments:  Postural dysfunction, Hypomobility, Decreased strength, Decreased endurance, Decreased activity tolerance, Difficulty walking, Impaired flexibility, Decreased mobility, Pain, Impaired sensation  Visit Diagnosis: Muscle weakness (generalized) - Plan: PT plan of care cert/re-cert  Difficulty in walking, not elsewhere classified - Plan: PT plan of care cert/re-cert  Pain in left hip - Plan: PT plan of care cert/re-cert  Pain in right hip - Plan: PT plan of care cert/re-cert  Bilateral low back pain without sciatica - Plan: PT plan of care cert/re-cert     Problem List Patient Active Problem List   Diagnosis Date Noted  . Lumbar radiculitis 11/15/2010  . Numbness in both legs 11/15/2010  . Hypothyroidism 09/23/2010  . Fibromyalgia 09/23/2010   Stacy Gardner, SPT  This entire session was performed under direct supervision and direction of a licensed therapist/therapist assistant . I have personally read, edited and approve of the note as  written.  Trotter,Margaret PT, DPT 01/21/2016, 8:55 AM  [place] MAIN [facility] SERVICES [address] [place], [place], [address] Phone: [phone]   Fax:  [phone]  Name: [name] MRN: [id number] Date of Birth: 1971-05-16

## 2016-01-20 NOTE — Patient Instructions (Addendum)
Piriformis Stretch, Sitting PNF    Sit, one ankle on opposite knee, same-side hand on crossed knee. Push down on knee, keeping spine straight. Lean torso forward until tension is felt in hamstrings and gluteals of crossed-leg side. Contract hip muscles so knee rotates further toward floor, resisting with hand. Hold stretch for 30 seconds x 2 sets on each leg.   Copyright  VHI. All rights reserved.  Outer Hip Stretch: Reclined IT Band Stretch (Strap)    Strap around opposite foot, pull across only as far as possible with shoulders on mat. Keep opposite leg bent, to support low back, perform on both sides, 3 sets x 20 seconds each   Copyright  VHI. All rights reserved.

## 2016-01-26 ENCOUNTER — Ambulatory Visit: Payer: BLUE CROSS/BLUE SHIELD | Admitting: Physical Therapy

## 2016-01-26 DIAGNOSIS — R262 Difficulty in walking, not elsewhere classified: Secondary | ICD-10-CM

## 2016-01-26 DIAGNOSIS — M6281 Muscle weakness (generalized): Secondary | ICD-10-CM

## 2016-01-26 DIAGNOSIS — M545 Low back pain, unspecified: Secondary | ICD-10-CM

## 2016-01-26 DIAGNOSIS — M25552 Pain in left hip: Secondary | ICD-10-CM

## 2016-01-26 DIAGNOSIS — M25551 Pain in right hip: Secondary | ICD-10-CM

## 2016-01-26 NOTE — Therapy (Signed)
[place] MAIN [facility] SERVICES [address] [place], [place], [address] Phone: [phone]   Fax:  [phone]  Physical Therapy Treatment  Patient Details  Name: [name] MRN: [id number] Date of Birth: 05-13-1971 Referring Provider: Sharlet Salina, MD  Encounter Date: 01/26/2016      PT End of Session - 01/26/16 0825    Visit Number 2   Number of Visits 13   Date for PT Re-Evaluation 03/02/16   PT Start Time 0815   PT Stop Time 0900   PT Time Calculation (min) 45 min   Activity Tolerance Patient tolerated treatment well;No increased pain   Behavior During Therapy WFL for tasks assessed/performed      Past Medical History:  Diagnosis Date  . Anxiety    under control   . Depression    under control  . Fibromyalgia 2008  . Hypothyroidism   . Thyroid disease     Past Surgical History:  Procedure Laterality Date  . ABDOMINAL HYSTERECTOMY    . ABLATION ON ENDOMETRIOSIS N/A 02/05/2013   Procedure: ABLATION ON ENDOMETRIOSIS;  Surgeon: Olga Millers, MD;  Location: [facility];  Service: Gynecology;  Laterality: N/A;  . ANTERIOR AND POSTERIOR REPAIR N/A 09/26/2013   Procedure: EXCISION OF PROLAPSED FALLOPIAN TUBES/ VAGINAL CUFF REPAIR.;  Surgeon: Olga Millers, MD;  Location: [facility];  Service: Gynecology;  Laterality: N/A;  EXCISION OF PROLAPSED FALLOPIAN TUBE / VAGINAL CUFF REPAIR.  Marland Kitchen LAPAROSCOPY N/A 02/05/2013   Procedure: LAPAROSCOPY DIAGNOSTIC;  Surgeon: Olga Millers, MD;  Location: [facility];  Service: Gynecology;  Laterality: N/A;  . LAPAROSCOPY ABDOMEN DIAGNOSTIC    . TONSILLECTOMY    . VAGINAL HYSTERECTOMY N/A 02/05/2013   Procedure: HYSTERECTOMY VAGINAL;  Surgeon: Olga Millers, MD;  Location: [facility];  Service: Gynecology;  Laterality: N/A;    There were no vitals filed for this visit.      Subjective Assessment - 01/26/16 0818    Subjective Pt reports her pain is located in her bilateral hips and is constant  with a burning sensation. It occurs with sleeping, lifting, carrying heavy objects. Pt has fibromyalgia but does not feel like it is fibromyalgia pain. The left hip started first and then to the R hip.  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     Pertinent History Personal factors: PLOF, work at home, fibromyalgia, chronicity    Limitations Lifting;Standing;Walking   How long can you sit comfortably? less than 5 minutes     How long can you stand comfortably? 5 minutes    How long can you walk comfortably? 5 minutes    Diagnostic tests 12/04/15-MRI:Small L5-S1 disc bulge with mild left-sided foraminal narrowing.   Patient Stated Goals get rid of pain, be able to sleep on side, carry items without feeling like leg is giving out,    Pain Onset Other (comment)  over a year    Pain Onset Other (comment)  greater than a year                      Adult Aquatic Therapy - 01/26/16 0851      Aquatic Therapy Subjective   Subjective pt had no complaints. Pt stated she feels the water helps her body to open up and no feel stiff like a board.         O: Pt entered/exited the pool via  steps with single UE support on rail.  50 ft =1 lap  Exercises performed in 3'6" depth   Stretches/ ROM exercises: 3 reps each side   (in the beginning and end of session)  6 directions of neck and spine ROM at all joints  Figure -4  Stretch B  Hip flexor stretch by step, hip flexor stretch with a twist with cues for proper alignment  Spinal traction from mini squat position with BUE on rail  Hip circles  Heel raises 10 reps with cue for slowed lowering and reset in pelvic neutral, weight bearing equal across ballmound and heels  Functional exercises  2 laps walking with cues for ballmound contact  4 laps walking backwards seated on noodle, and noodle under armpit  10 reps mini squat with proper alignment   Cued for decreased hyperextended  knees, decreased hip IR/genu valgus, pronation  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    Time 6   Period Weeks   Status New     PT LONG TERM GOAL #2   Title Pt will self report worse pain of 3/10 in low back/bilateral hips in order to improve tolerance and promote greater functional mobility.     Time 6   Period Weeks   Status New     PT LONG TERM GOAL #3   Title Pt will demonstrate grossly 4+/5 strength in RLE/LLE for greater gait mechanics and more functional mobility      Time 6   Period Weeks   Status New     PT LONG TERM GOAL #4   Title Pt will be able to sleep on either side for the entire night without being woken up by pain.     Time 6   Period Weeks   Status New     PT LONG TERM GOAL #5   Title Pt will be able to carry a water pale to her animals without the feeling of L leg giving out or increased back pain.     Time 6   Period Weeks   Status New               Plan - 01/26/16 [id number]    Clinical Impression Statement Following today's session, pt felt 70% better with report of feeling less burning in her hips and her back being less tight. Pt reported 50% relief of pain with cuing for decreased hyperextended knees.  Pt will benefit from skilled PT in order to address her  feet and knee mechanics which showed to make an improvement with her hip and back pain.    Rehab Potential Fair   Clinical Impairments Affecting Rehab Potential Positive: age, motivated, active home lifestyle    Negative: bulging disc, chroncity, fibromyalgia, decreased acitvity level, falls risk    clinical presentation:evolving    PT Frequency 2x / week   PT Duration 6 weeks   PT  Treatment/Interventions Neuromuscular re-education;Aquatic Therapy;Cryotherapy;Traction;Ultrasound;Balance training;Therapeutic exercise;Therapeutic activities;Functional mobility training;Stair training;Gait training;Patient/family education;Energy conservation   PT Next Visit Plan aquatic therapy, review IT band and piriformis stretch    PT  Home Exercise Plan see pt instructions    Consulted and Agree with Plan of Care Patient      Patient will benefit from skilled therapeutic intervention in order to improve the following deficits and impairments:  Postural dysfunction, Hypomobility, Decreased strength, Decreased endurance, Decreased activity tolerance, Difficulty walking, Impaired flexibility, Decreased mobility, Pain, Impaired sensation  Visit Diagnosis: Muscle weakness (generalized)  Difficulty in walking, not elsewhere classified  Pain in left hip  Pain in right hip  Bilateral low back pain without sciatica     Problem List Patient Active Problem List   Diagnosis Date Noted  . Lumbar radiculitis 11/15/2010  . Numbness in both legs 11/15/2010  . Hypothyroidism 09/23/2010  . Fibromyalgia 09/23/2010    Jerl Mina ,PT, DPT, E-[id number]  01/26/2016, 8:55 AM  [place] MAIN [facility] SERVICES [address] [place], [place], [address] Phone: [phone]   Fax:  [phone]  Name: SABIRIN KRAVCHENKO MRN: [id number] Date of Birth: 03-31-1971

## 2016-01-28 ENCOUNTER — Ambulatory Visit: Payer: BLUE CROSS/BLUE SHIELD | Admitting: Physical Therapy

## 2016-02-02 ENCOUNTER — Ambulatory Visit: Payer: BLUE CROSS/BLUE SHIELD | Admitting: Physical Therapy

## 2016-02-02 DIAGNOSIS — M25551 Pain in right hip: Secondary | ICD-10-CM

## 2016-02-02 DIAGNOSIS — M545 Low back pain, unspecified: Secondary | ICD-10-CM

## 2016-02-02 DIAGNOSIS — M25552 Pain in left hip: Secondary | ICD-10-CM

## 2016-02-02 DIAGNOSIS — M6281 Muscle weakness (generalized): Secondary | ICD-10-CM | POA: Diagnosis not present

## 2016-02-02 DIAGNOSIS — R262 Difficulty in walking, not elsewhere classified: Secondary | ICD-10-CM

## 2016-02-02 NOTE — Therapy (Signed)
[place] MAIN [facility] SERVICES [address] [place], [place], [address] Phone: [phone]   Fax:  [phone]  Physical Therapy Treatment  Patient Details  Name: [name] MRN: [id number] Date of Birth: [birth year] Referring Provider: Sharlet Salina, MD  Encounter Date: 02/02/2016      PT End of Session - 02/02/16 1217    Visit Number 3   Number of Visits 13   Date for PT Re-Evaluation 03/02/16   PT Start Time 1112   PT Stop Time 1150   PT Time Calculation (min) 38 min   Activity Tolerance Patient tolerated treatment well;No increased pain   Behavior During Therapy WFL for tasks assessed/performed      Past Medical History:  Diagnosis Date  . Anxiety    under control   . Depression    under control  . Fibromyalgia 2008  . Hypothyroidism   . Thyroid disease     Past Surgical History:  Procedure Laterality Date  . ABDOMINAL HYSTERECTOMY    . ABLATION ON ENDOMETRIOSIS N/A 02/05/2013   Procedure: ABLATION ON ENDOMETRIOSIS;  Surgeon: Olga Millers, MD;  Location: [facility];  Service: Gynecology;  Laterality: N/A;  . ANTERIOR AND POSTERIOR REPAIR N/A 09/26/2013   Procedure: EXCISION OF PROLAPSED FALLOPIAN TUBES/ VAGINAL CUFF REPAIR.;  Surgeon: Olga Millers, MD;  Location: [facility];  Service: Gynecology;  Laterality: N/A;  EXCISION OF PROLAPSED FALLOPIAN TUBE / VAGINAL CUFF REPAIR.  Marland Kitchen LAPAROSCOPY N/A 02/05/2013   Procedure: LAPAROSCOPY DIAGNOSTIC;  Surgeon: Olga Millers, MD;  Location: [facility];  Service: Gynecology;  Laterality: N/A;  . LAPAROSCOPY ABDOMEN DIAGNOSTIC    . TONSILLECTOMY    . VAGINAL HYSTERECTOMY N/A 02/05/2013   Procedure: HYSTERECTOMY VAGINAL;  Surgeon: Olga Millers, MD;  Location: [facility];  Service: Gynecology;  Laterality: N/A;    There were no vitals filed for this visit.      Subjective Assessment - 02/02/16 1214    Subjective Pt reported the HEP on standing less on her heels has helped her feet  pain by 75%. Her hip pain had returned following last session but she continues to perform her HEP.  Pt cancelled her last appt due to sickness.    Pertinent History Personal factors: PLOF, work at home, fibromyalgia, chronicity    Limitations Lifting;Standing;Walking   How long can you sit comfortably? less than 5 minutes     How long can you stand comfortably? 5 minutes    How long can you walk comfortably? 5 minutes    Diagnostic tests 12/04/15-MRI:Small L5-S1 disc bulge with mild left-sided foraminal narrowing.   Patient Stated Goals get rid of pain, be able to sleep on side, carry items without feeling like leg is giving out,    Pain Onset Other (comment)  over a year    Pain Onset Other (comment)  greater than a year                      Adult Aquatic Therapy - 02/02/16 1216      Aquatic Therapy Subjective   Subjective pt had no complaints. Pt stated she feels the water helps her body to open up and no feel stiff like a board.        O: Pt entered/exited the pool via steps with single UE support on rail.  50 ft =1 lap  Exercises performed in 3'6" depth  Stretches/ ROM exercises: 3 reps each side   (in  the beginning and end of session)  6 directions of neck and spine, Shoulder roll backwards Figure -4  Stretch B  Hip circles  Hip flexor stretch by step, hip flexor stretch with a twist with cues for proper alignment  Spinal traction from mini squat position with BUE on rail    2 laps sidestepping with cuing on alignment feet / knees  Figure -4  Stretch B  Stretches   2 laps sidestepping with mini squat with cuing on alignment   Figure -4  Stretch B   Hamstring stretch by wall with feet/knee alignment and with trunk rotation  Guided body scan x 2 reps  with pt floating on noodles ,supported by PT   Educated on benefits of consistent HEP and body scan practice, and strengthening exercises to address pain                   PT Long Term  Goals - 01/20/16 1701      PT LONG TERM GOAL #1   Title Pt will become independent with HEP at home to increase functional independence with all ADLS.    Time 6   Period Weeks   Status New     PT LONG TERM GOAL #2   Title Pt will self report worse pain of 3/10 in low back/bilateral hips in order to improve tolerance and promote greater functional mobility.     Time 6   Period Weeks   Status New     PT LONG TERM GOAL #3   Title Pt will demonstrate grossly 4+/5 strength in RLE/LLE for greater gait mechanics and more functional mobility      Time 6   Period Weeks   Status New     PT LONG TERM GOAL #4   Title Pt will be able to sleep on either side for the entire night without being woken up by pain.     Time 6   Period Weeks   Status New     PT LONG TERM GOAL #5   Title Pt will be able to carry a water pale to her animals without the feeling of L leg giving out or increased back pain.     Time 6   Period Weeks   Status New               Plan - 02/02/16 1217    Clinical Impression Statement Pt had no complaints today. Focused her new exercises on propioception training on increased supination weight bearing across  transverse arch along with slight hip ER bilaterally in bilateral stance, mini-squats, and sidestepping. Anticipate ther ex and neuromuscular edu will help  to address her  pronation/ genu valgus which is likely contributing to her hip pain.  Also provided cuing on alignment and stability principles with BLE / hips in stretches to increase awareness to dissassociation of trunk from pelvis and not overstretching.  Pt demo'd correctly. Guided body scan technique with explanation on benefits for relaxation and retraining pain. Pt reported feeling "very relaxing".  Pt is progressing well towards her goals.       Rehab Potential Fair   Clinical Impairments Affecting Rehab Potential Positive: age, motivated, active home lifestyle    Negative: bulging disc, chroncity,  fibromyalgia, decreased acitvity level, falls risk    clinical presentation:evolving    PT Frequency 2x / week   PT Duration 6 weeks   PT Treatment/Interventions Neuromuscular re-education;Aquatic Therapy;Cryotherapy;Traction;Ultrasound;Balance training;Therapeutic exercise;Therapeutic activities;Functional mobility training;Stair training;Gait training;Patient/family education;Energy  conservation   PT Next Visit Plan aquatic therapy, review IT band and piriformis stretch    PT Home Exercise Plan see pt instructions    Consulted and Agree with Plan of Care Patient      Patient will benefit from skilled therapeutic intervention in order to improve the following deficits and impairments:  Postural dysfunction, Hypomobility, Decreased strength, Decreased endurance, Decreased activity tolerance, Difficulty walking, Impaired flexibility, Decreased mobility, Pain, Impaired sensation  Visit Diagnosis: Muscle weakness (generalized)  Difficulty in walking, not elsewhere classified  Pain in left hip  Pain in right hip  Bilateral low back pain without sciatica     Problem List Patient Active Problem List   Diagnosis Date Noted  . Lumbar radiculitis 11/15/2010  . Numbness in both legs 11/15/2010  . Hypothyroidism 09/23/2010  . Fibromyalgia 09/23/2010    Jerl Mina ,PT, DPT, E-RYT  02/02/2016, 12:22 PM  [place] MAIN [facility] SERVICES [address] [place], [place], [address] Phone: [phone]   Fax:  [phone]  Name: KEYLY DIA MRN: [id number] Date of Birth: 1970/12/10

## 2016-02-04 ENCOUNTER — Ambulatory Visit: Payer: BLUE CROSS/BLUE SHIELD

## 2016-02-04 DIAGNOSIS — M25551 Pain in right hip: Secondary | ICD-10-CM

## 2016-02-04 DIAGNOSIS — M6281 Muscle weakness (generalized): Secondary | ICD-10-CM

## 2016-02-04 DIAGNOSIS — M25552 Pain in left hip: Secondary | ICD-10-CM

## 2016-02-04 NOTE — Therapy (Signed)
[place] MAIN [facility] SERVICES [address] [place], [place], [address] Phone: [phone]   Fax:  [phone]  Physical Therapy Treatment  Patient Details  Name: [name] MRN: [id number] Date of Birth: 1971/03/13 Referring Provider: Sharlet Salina, MD  Encounter Date: 02/04/2016      PT End of Session - 02/04/16 1207    Visit Number 4   Number of Visits 13   Date for PT Re-Evaluation 03/02/16   PT Start Time 0810   PT Stop Time 0845   PT Time Calculation (min) 35 min   Activity Tolerance Patient tolerated treatment well;No increased pain   Behavior During Therapy WFL for tasks assessed/performed      Past Medical History:  Diagnosis Date  . Anxiety    under control   . Depression    under control  . Fibromyalgia 2008  . Hypothyroidism   . Thyroid disease     Past Surgical History:  Procedure Laterality Date  . ABDOMINAL HYSTERECTOMY    . ABLATION ON ENDOMETRIOSIS N/A 02/05/2013   Procedure: ABLATION ON ENDOMETRIOSIS;  Surgeon: Olga Millers, MD;  Location: [facility];  Service: Gynecology;  Laterality: N/A;  . ANTERIOR AND POSTERIOR REPAIR N/A 09/26/2013   Procedure: EXCISION OF PROLAPSED FALLOPIAN TUBES/ VAGINAL CUFF REPAIR.;  Surgeon: Olga Millers, MD;  Location: [facility];  Service: Gynecology;  Laterality: N/A;  EXCISION OF PROLAPSED FALLOPIAN TUBE / VAGINAL CUFF REPAIR.  Marland Kitchen LAPAROSCOPY N/A 02/05/2013   Procedure: LAPAROSCOPY DIAGNOSTIC;  Surgeon: Olga Millers, MD;  Location: [facility];  Service: Gynecology;  Laterality: N/A;  . LAPAROSCOPY ABDOMEN DIAGNOSTIC    . TONSILLECTOMY    . VAGINAL HYSTERECTOMY N/A 02/05/2013   Procedure: HYSTERECTOMY VAGINAL;  Surgeon: Olga Millers, MD;  Location: [facility];  Service: Gynecology;  Laterality: N/A;    There were no vitals filed for this visit.      Subjective Assessment - 02/04/16 1204    Subjective Pt reports continued neck, back, and hip pain which is consistently  rated around 8/10. No specific questions or concerns on this date. Pt reports she is performing stretches at home. Overall she reports that she believes physical therapy is helping with her pain.    Pertinent History Personal factors: PLOF, work at home, fibromyalgia, chronicity    Limitations Lifting;Standing;Walking   How long can you sit comfortably? less than 5 minutes     How long can you stand comfortably? 5 minutes    How long can you walk comfortably? 5 minutes    Diagnostic tests 12/04/15-MRI:Small L5-S1 disc bulge with mild left-sided foraminal narrowing.   Patient Stated Goals get rid of pain, be able to sleep on side, carry items without feeling like leg is giving out,    Currently in Pain? Yes   Pain Score 8    Pain Location Hip   Pain Orientation Right;Left   Pain Descriptors / Indicators Burning   Pain Type Chronic pain   Pain Onset Other (comment)  over a year    Pain Frequency Constant   Pain Onset --  greater than a year        TREATMENT Pt entered/exited the pool via steps with single UE support on rail.  50 ft =1 length  Exercises performed in 3'6" depth   2 lengths sidestepping with cuing on alignment feet / knees Standing slow controlled marches x 1 minute; Standing hip abduction bilateral x 1 minute each; Standing hip circles CW/CCW  x 20 each; Spinal traction from mini squat position with BUE on rail, added lateral trunk flexion to isolate each side 30 seconds x 2; Seated hamstring stretch with pool noodle assist 30 seconds x 2 bilateral; Seated figure 4 30 seconds x 2 bilateral; Seated flutter kicks 1 minute x 2; Seated bicycle kicks 1 minute x 2; Seated scissor kids 1 minute x 2;   Cues provided during session for proper form/technique. Intensity modified to Education regarding chronic pain as well as HEP;                               PT Education - 02/04/16 1206    Education provided Yes   Education Details  reinforced HEP including stretches   Person(s) Educated Patient   Methods Explanation   Comprehension Verbalized understanding             PT Long Term Goals - 01/20/16 1701      PT LONG TERM GOAL #1   Title Pt will become independent with HEP at home to increase functional independence with all ADLS.    Time 6   Period Weeks   Status New     PT LONG TERM GOAL #2   Title Pt will self report worse pain of 3/10 in low back/bilateral hips in order to improve tolerance and promote greater functional mobility.     Time 6   Period Weeks   Status New     PT LONG TERM GOAL #3   Title Pt will demonstrate grossly 4+/5 strength in RLE/LLE for greater gait mechanics and more functional mobility      Time 6   Period Weeks   Status New     PT LONG TERM GOAL #4   Title Pt will be able to sleep on either side for the entire night without being woken up by pain.     Time 6   Period Weeks   Status New     PT LONG TERM GOAL #5   Title Pt will be able to carry a water pale to her animals without the feeling of L leg giving out or increased back pain.     Time 6   Period Weeks   Status New               Plan - 02/04/16 1207    Clinical Impression Statement Pt demonstrates very slow and guarded movements during therapy session on this date. She denies any increase in her pain during session. She reports she feels more limber after session but denies any true decrease in pain. Pt provided cues throughout session for proper technique with exercises. Encouraged pt to continue HEP and follow-up as scheduled.    Rehab Potential Fair   Clinical Impairments Affecting Rehab Potential Positive: age, motivated, active home lifestyle    Negative: bulging disc, chroncity, fibromyalgia, decreased acitvity level, falls risk    clinical presentation:evolving    PT Frequency 2x / week   PT Duration 6 weeks   PT Treatment/Interventions Neuromuscular re-education;Aquatic  Therapy;Cryotherapy;Traction;Ultrasound;Balance training;Therapeutic exercise;Therapeutic activities;Functional mobility training;Stair training;Gait training;Patient/family education;Energy conservation   PT Next Visit Plan aquatic therapy, review IT band and piriformis stretch    PT Home Exercise Plan see pt instructions    Consulted and Agree with Plan of Care Patient      Patient will benefit from skilled therapeutic intervention in order to improve the following deficits and impairments:  Postural dysfunction, Hypomobility, Decreased strength, Decreased endurance, Decreased activity tolerance, Difficulty walking, Impaired flexibility, Decreased mobility, Pain, Impaired sensation  Visit Diagnosis: Muscle weakness (generalized)  Pain in left hip  Pain in right hip     Problem List Patient Active Problem List   Diagnosis Date Noted  . Lumbar radiculitis 11/15/2010  . Numbness in both legs 11/15/2010  . Hypothyroidism 09/23/2010  . Fibromyalgia 09/23/2010    Huprich,Jason 02/04/2016, 12:21 PM  [place] MAIN [facility] SERVICES [address] [place], [place], [address] Phone: [phone]   Fax:  [phone]  Name: [name] MRN: [id number] Date of Birth: 1970/07/19

## 2016-02-09 ENCOUNTER — Ambulatory Visit: Payer: BLUE CROSS/BLUE SHIELD | Admitting: Physical Therapy

## 2016-02-11 ENCOUNTER — Ambulatory Visit: Payer: BLUE CROSS/BLUE SHIELD | Admitting: Physical Therapy

## 2016-02-17 ENCOUNTER — Encounter: Payer: BLUE CROSS/BLUE SHIELD | Admitting: Physical Therapy

## 2017-02-07 ENCOUNTER — Telehealth: Payer: Self-pay | Admitting: *Deleted

## 2017-02-07 NOTE — Telephone Encounter (Addendum)
Error

## 2017-02-07 NOTE — Telephone Encounter (Signed)
Error in telephone encounter - incorrect patient.

## 2017-06-21 ENCOUNTER — Ambulatory Visit (INDEPENDENT_AMBULATORY_CARE_PROVIDER_SITE_OTHER): Payer: BLUE CROSS/BLUE SHIELD

## 2017-06-21 ENCOUNTER — Ambulatory Visit: Payer: BLUE CROSS/BLUE SHIELD | Admitting: Podiatry

## 2017-06-21 ENCOUNTER — Encounter: Payer: Self-pay | Admitting: Podiatry

## 2017-06-21 VITALS — BP 121/68 | HR 67 | Resp 16

## 2017-06-21 DIAGNOSIS — M722 Plantar fascial fibromatosis: Secondary | ICD-10-CM

## 2017-06-21 DIAGNOSIS — R5383 Other fatigue: Secondary | ICD-10-CM | POA: Insufficient documentation

## 2017-06-21 MED ORDER — METHYLPREDNISOLONE 4 MG PO TBPK: ORAL_TABLET | ORAL | 0 refills | Status: DC

## 2017-06-21 MED ORDER — MELOXICAM 15 MG PO TABS: 15.0000 mg | ORAL_TABLET | Freq: Every day | ORAL | 3 refills | Status: AC

## 2017-06-21 NOTE — Patient Instructions (Signed)

## 2017-06-21 NOTE — Progress Notes (Signed)
Subjective:  Patient ID: [name], female    DOB: September 02, 1970,  MRN: [id number] HPI Chief Complaint  Patient presents with  . Foot Pain    Plantar heel bilateral (R>L) - aching x 1-2 months, AM pain, worse at night, thought initially was coming from fibromyalgia, tried meloxicam, but no help so discontinued, also some forefoot pain bilateral     47 y.o. female presents with the above complaint.     Past Medical History:  Diagnosis Date  . Anxiety    under control   . Depression    under control  . Fibromyalgia 2008  . Hypothyroidism   . Thyroid disease    Past Surgical History:  Procedure Laterality Date  . ABDOMINAL HYSTERECTOMY    . ABLATION ON ENDOMETRIOSIS N/A 02/05/2013   Procedure: ABLATION ON ENDOMETRIOSIS;  Surgeon: Olga Millers, MD;  Location: [facility];  Service: Gynecology;  Laterality: N/A;  . ANTERIOR AND POSTERIOR REPAIR N/A 09/26/2013   Procedure: EXCISION OF PROLAPSED FALLOPIAN TUBES/ VAGINAL CUFF REPAIR.;  Surgeon: Olga Millers, MD;  Location: [facility];  Service: Gynecology;  Laterality: N/A;  EXCISION OF PROLAPSED FALLOPIAN TUBE / VAGINAL CUFF REPAIR.  Marland Kitchen LAPAROSCOPY N/A 02/05/2013   Procedure: LAPAROSCOPY DIAGNOSTIC;  Surgeon: Olga Millers, MD;  Location: [facility];  Service: Gynecology;  Laterality: N/A;  . LAPAROSCOPY ABDOMEN DIAGNOSTIC    . TONSILLECTOMY    . VAGINAL HYSTERECTOMY N/A 02/05/2013   Procedure: HYSTERECTOMY VAGINAL;  Surgeon: Olga Millers, MD;  Location: [facility];  Service: Gynecology;  Laterality: N/A;    Current Outpatient Medications:  .  Thiamine Mononitrate (VITAMIN B1 PO), Take by mouth., Disp: , Rfl:  .  Zinc Acetate, Oral, (ZINC ACETATE PO), Take by mouth., Disp: , Rfl:  .  CYANOCOBALAMIN PO, Take 5,000 mcg by mouth daily., Disp: , Rfl:  .  DULoxetine HCl (CYMBALTA PO), Take by mouth., Disp: , Rfl:  .  levothyroxine (SYNTHROID, LEVOTHROID) 75 MCG tablet, Take 75 mcg by mouth daily.  , Disp: , Rfl:   Allergies  Allergen  Reactions  . Aspirin     bleeding   Review of Systems  All other systems reviewed and are negative.  Objective:   Vitals:   06/21/17 1338  BP: 121/68  Pulse: 67  Resp: 16    General: Well developed, nourished, in no acute distress, alert and oriented x3   Dermatological: Skin is warm, dry and supple bilateral. Nails x 10 are well maintained; remaining integument appears unremarkable at this time. There are no open sores, no preulcerative lesions, no rash or signs of infection present.  Vascular: Dorsalis Pedis artery and Posterior Tibial artery pedal pulses are 2/4 bilateral with immedate capillary fill time. Pedal hair growth present. No varicosities and no lower extremity edema present bilateral.   Neruologic: Grossly intact via light touch bilateral. Vibratory intact via tuning fork bilateral. Protective threshold with Semmes Wienstein monofilament intact to all pedal sites bilateral. Patellar and Achilles deep tendon reflexes 2+ bilateral. No Babinski or clonus noted bilateral.   Musculoskeletal: No gross boney pedal deformities bilateral. No pain, crepitus, or limitation noted with foot and ankle range of motion bilateral. Muscular strength 5/5 in all groups tested bilateral.  Gait: Unassisted, Nonantalgic.    Radiographs:  Radiographs taken today right foot demonstrates an osseously mature individual soft tissue increase in density plantar fascial calcaneal insertion site of the right heel.  No acute trauma.  Assessment & Plan:   Assessment: Plantar fasciitis right.  Plan: Discussed etiology pathology conservative versus surgical therapies.  Injected the right heel with Kenalog and local anesthetic 20 mg of Kenalog 5 mg of Marcaine.  This is performed after sterile Betadine skin prep and verbal consent.  Placed her in plantar fascial brace bilaterally and a single night splint.  Start her on a Medrol Dosepak to be followed by meloxicam.  Follow-up with her in 1  month.     Max T. [place], [place]

## 2017-07-26 ENCOUNTER — Ambulatory Visit: Payer: BLUE CROSS/BLUE SHIELD | Admitting: Podiatry

## 2017-09-24 IMAGING — MR MR LUMBAR SPINE W/O CM
4 of 5 series · 24 of 48 positions shown · non-contrast
Comparison: None.

CLINICAL DATA: 44 y/o F; 6 months of left hip pain with worsening
in the last 2 weeks. No history of cancer.

EXAM:
MRI LUMBAR SPINE WITHOUT CONTRAST
TECHNIQUE: Multiplanar, multisequence MR imaging of the lumbar spine was
performed. No intravenous contrast was administered.

[Series 2: T2 · sagittal · 4.0mm · 0.81mm/px · 6 of 15 slices shown (1 of 2)]
[im 1/15]
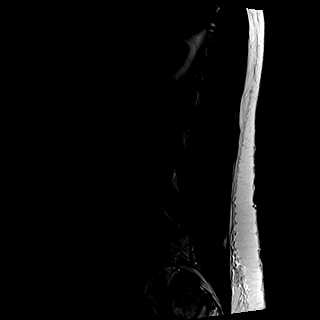
[im 3/15]
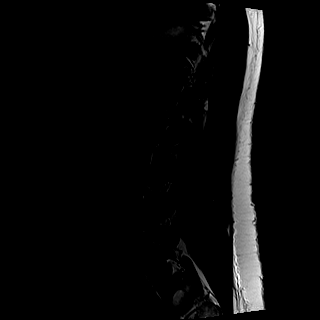
[im 6/15]
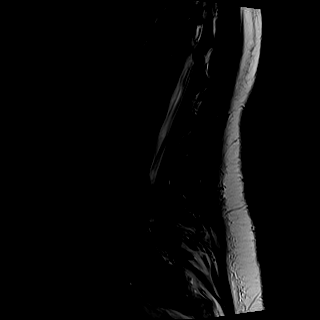
[im 9/15]
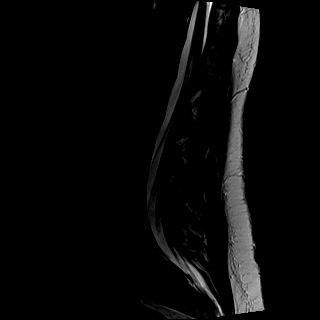
[im 12/15]
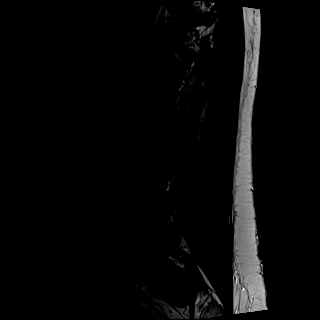
[im 15/15]
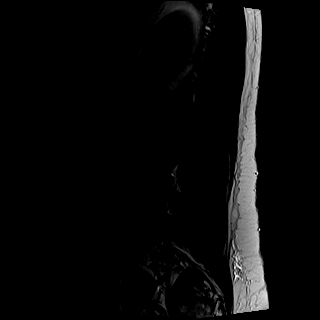

[Series 3: T1 · sagittal · 4.0mm · 0.41mm/px · 6 of 15 slices shown (1 of 2)]
[im 1/15]
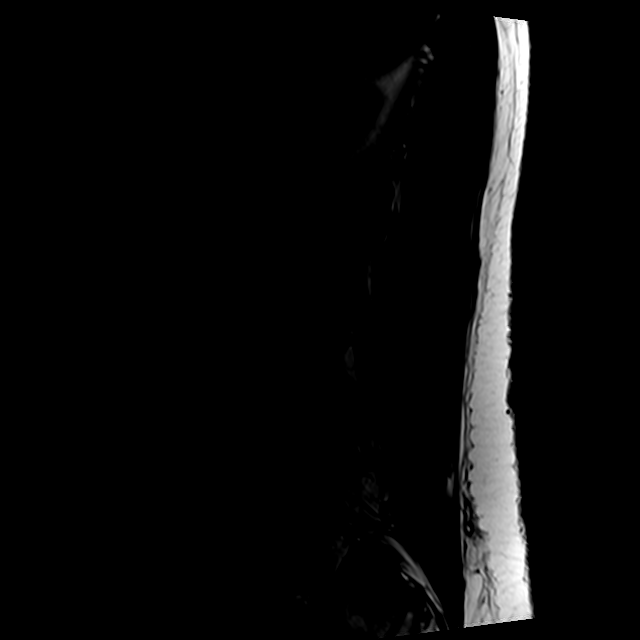
[im 3/15]
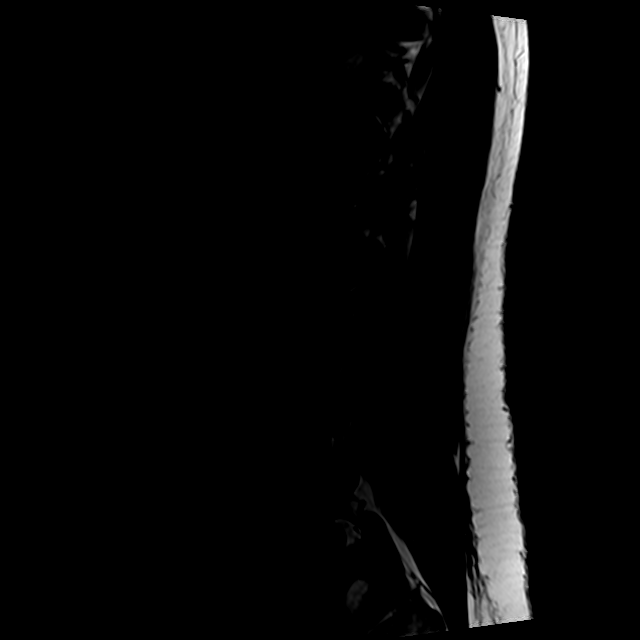
[im 6/15]
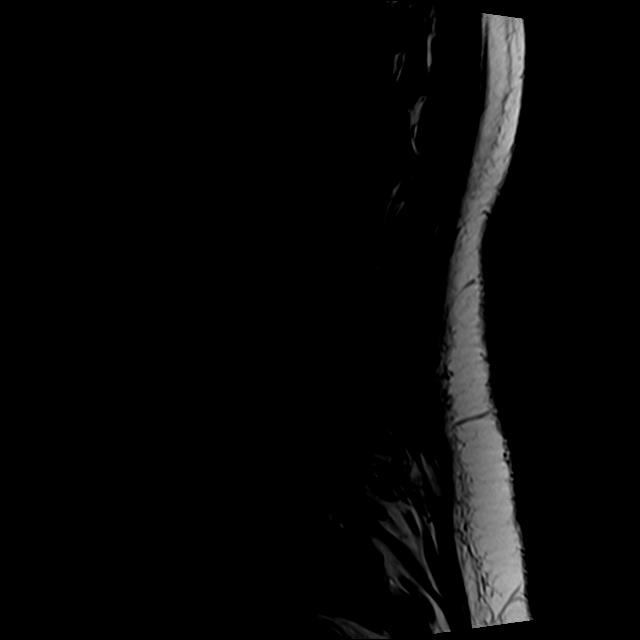
[im 9/15]
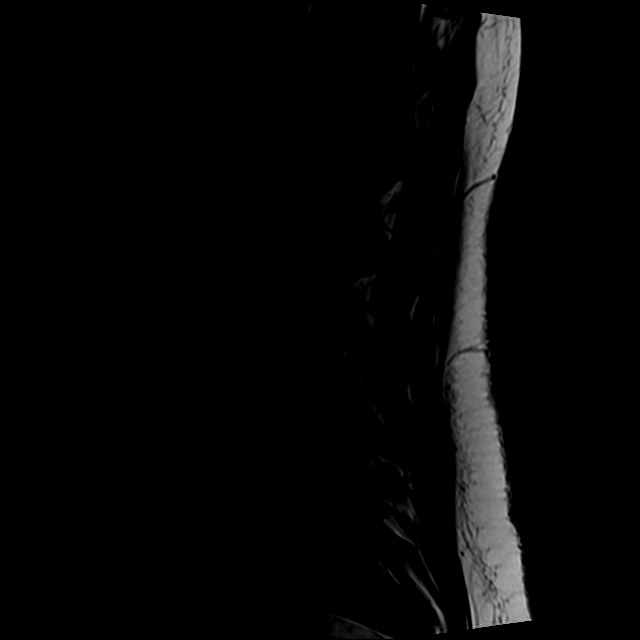
[im 12/15]
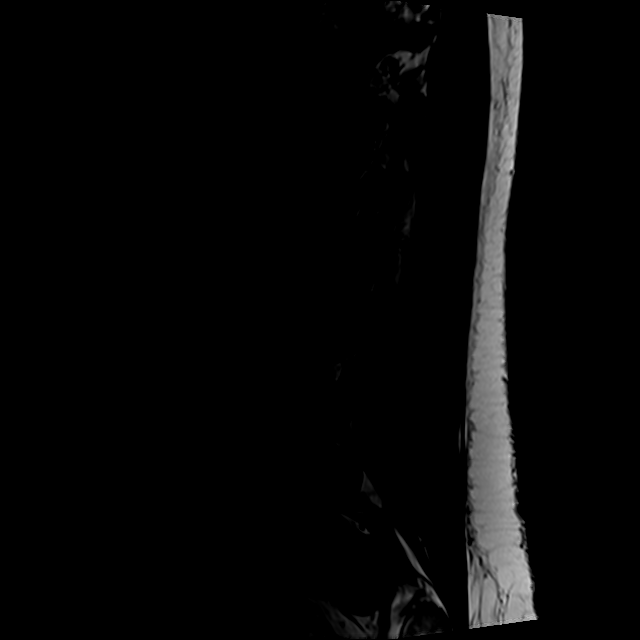
[im 15/15]
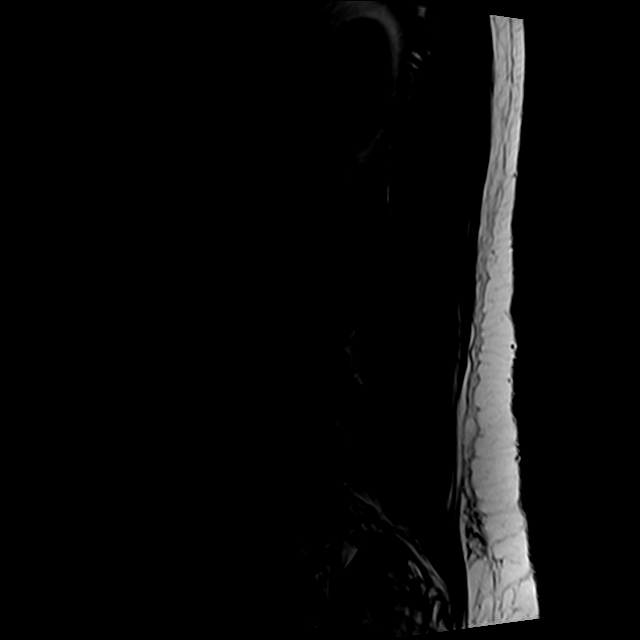

[Series 5: T2 · axial · 4.0mm · 0.78mm/px · z∈[-140,+92]mm · 9 of 41 slices shown (2 of 2)]
[im 1/41]
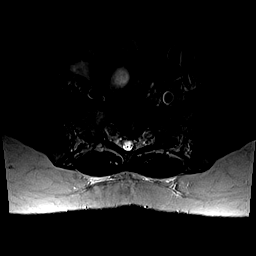
[im 6/41]
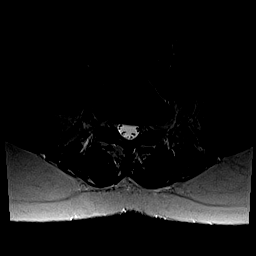
[im 12/41]
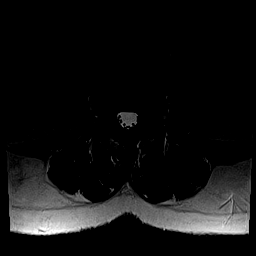
[im 18/41]
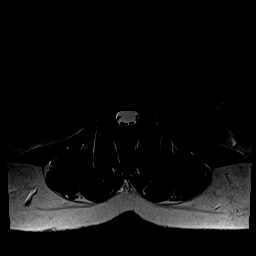
[im 21/41]
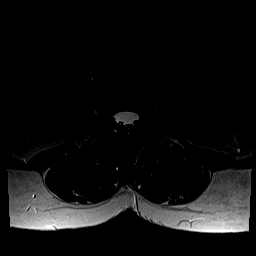
[im 23/41]
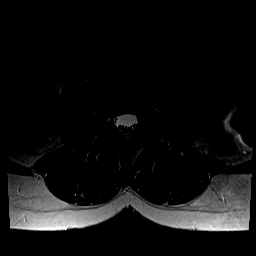
[im 29/41]
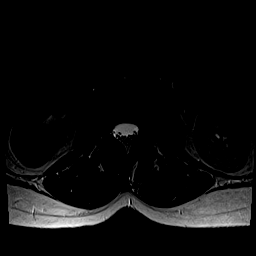
[im 35/41]
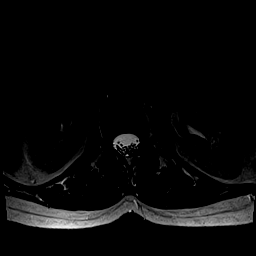
[im 41/41]
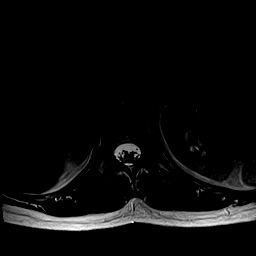

[Series 6: T1 · axial · 4.0mm · 0.31mm/px · z∈[-117,+62]mm · 3 of 41 slices shown (2 of 2)]
[im 6/41]
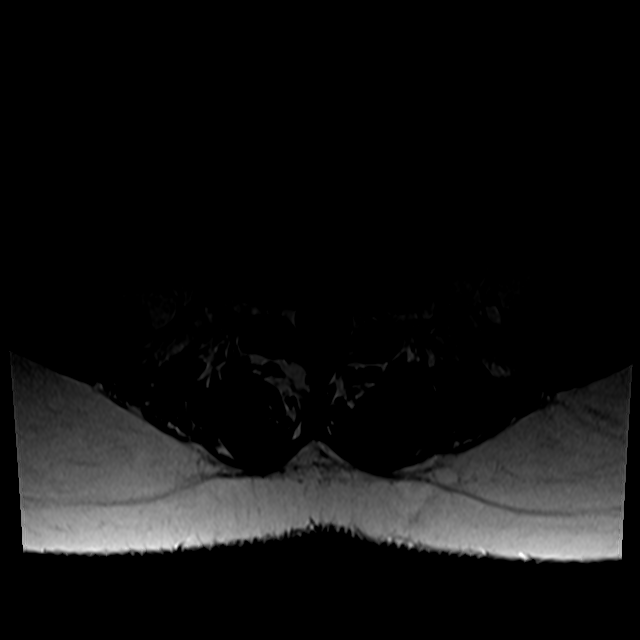
[im 21/41]
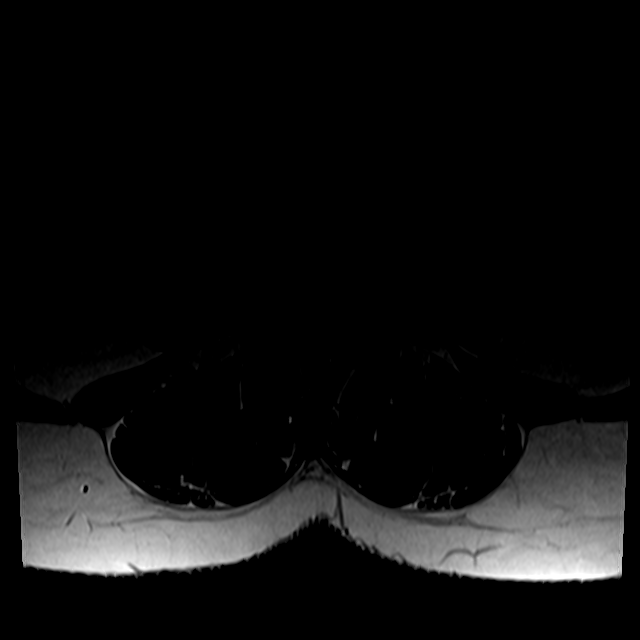
[im 35/41]
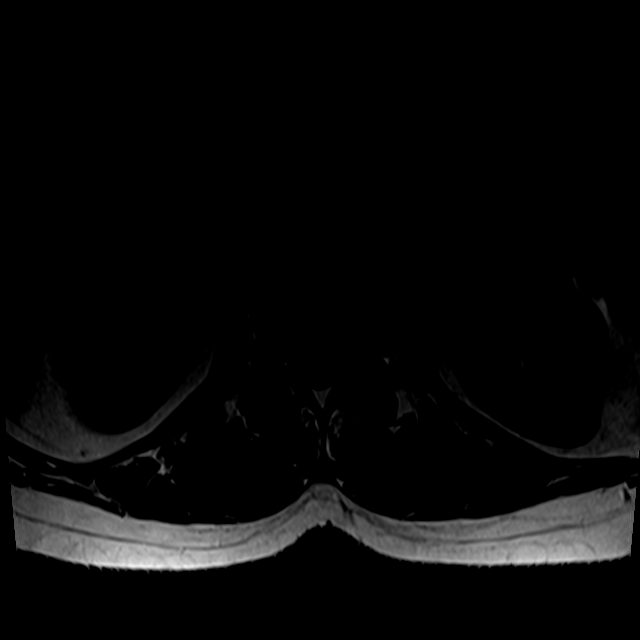

[24 of 48 positions shown; findings below may reference images not displayed]

FINDINGS: Segmentation:  Standard.

Alignment:  Physiologic.

Vertebrae: No fracture, evidence of discitis, or bone lesion. Mild
nonspecific diffuse marrow heterogeneity.

Conus medullaris: Extends to the L1-2 level and appears normal.

Paraspinal and other soft tissues: Negative.

Disc levels:

L1-2: No significant disc displacement, foraminal narrowing, or
canal stenosis.

L2-3: No significant disc displacement, foraminal narrowing, or
canal stenosis.

L3-4: Minimal disc bulge without significant foraminal narrowing or
canal stenosis.

L4-5: Minimal disc bulge without significant foraminal narrowing or
canal stenosis.

L5-S1: Small disc bulge eccentric to the left. Mild left foraminal
narrowing. No significant canal stenosis.
IMPRESSION: Small L5-S1 disc bulge with mild left-sided foraminal narrowing.

By: Njni Friend M.D.

## 2019-02-28 ENCOUNTER — Other Ambulatory Visit: Payer: Self-pay | Admitting: Obstetrics and Gynecology

## 2019-02-28 DIAGNOSIS — R928 Other abnormal and inconclusive findings on diagnostic imaging of breast: Secondary | ICD-10-CM

## 2019-03-04 ENCOUNTER — Other Ambulatory Visit: Payer: Self-pay

## 2019-03-04 ENCOUNTER — Ambulatory Visit
Admission: RE | Admit: 2019-03-04 | Discharge: 2019-03-04 | Disposition: A | Discharge status: Home / Self Care | Payer: BC Managed Care – PPO | Source: Ambulatory Visit | Attending: Obstetrics and Gynecology | Admitting: Obstetrics and Gynecology

## 2019-03-04 DIAGNOSIS — R928 Other abnormal and inconclusive findings on diagnostic imaging of breast: Secondary | ICD-10-CM

## 2020-09-14 ENCOUNTER — Other Ambulatory Visit (HOSPITAL_COMMUNITY): Payer: Self-pay | Admitting: Student

## 2020-09-14 ENCOUNTER — Other Ambulatory Visit: Payer: Self-pay | Admitting: Student

## 2020-09-14 DIAGNOSIS — R1310 Dysphagia, unspecified: Secondary | ICD-10-CM

## 2020-09-14 DIAGNOSIS — E039 Hypothyroidism, unspecified: Secondary | ICD-10-CM

## 2020-09-14 DIAGNOSIS — Z800 Family history of malignant neoplasm of digestive organs: Secondary | ICD-10-CM

## 2020-09-21 ENCOUNTER — Other Ambulatory Visit: Payer: Self-pay

## 2020-09-21 ENCOUNTER — Ambulatory Visit (HOSPITAL_COMMUNITY)
Admission: RE | Admit: 2020-09-21 | Discharge: 2020-09-21 | Disposition: A | Discharge status: Home / Self Care | Payer: BC Managed Care – PPO | Source: Ambulatory Visit | Attending: Student | Admitting: Student

## 2020-09-21 DIAGNOSIS — R1310 Dysphagia, unspecified: Secondary | ICD-10-CM | POA: Diagnosis present

## 2020-09-21 DIAGNOSIS — Z800 Family history of malignant neoplasm of digestive organs: Secondary | ICD-10-CM | POA: Diagnosis present

## 2020-09-21 DIAGNOSIS — E039 Hypothyroidism, unspecified: Secondary | ICD-10-CM | POA: Insufficient documentation

## 2021-05-31 ENCOUNTER — Encounter: Payer: Self-pay | Admitting: Internal Medicine

## 2021-05-31 ENCOUNTER — Ambulatory Visit (INDEPENDENT_AMBULATORY_CARE_PROVIDER_SITE_OTHER): Payer: BC Managed Care – PPO | Admitting: Internal Medicine

## 2021-05-31 ENCOUNTER — Other Ambulatory Visit: Payer: Self-pay

## 2021-05-31 VITALS — BP 124/88 | HR 77 | Ht 65.0 in | Wt 163.0 lb

## 2021-05-31 DIAGNOSIS — E039 Hypothyroidism, unspecified: Secondary | ICD-10-CM

## 2021-05-31 DIAGNOSIS — E041 Nontoxic single thyroid nodule: Secondary | ICD-10-CM | POA: Diagnosis not present

## 2021-05-31 LAB — T4, FREE: Free T4: 0.56 ng/dL — ABNORMAL LOW (ref 0.60–1.60)

## 2021-05-31 LAB — T3, FREE: T3, Free: 3.9 pg/mL (ref 2.3–4.2)

## 2021-05-31 LAB — TSH: TSH: 1.76 u[IU]/mL (ref 0.35–5.50)

## 2021-05-31 NOTE — Progress Notes (Signed)
"Really 8.7 patient ID: [name], female   DOB: 1970-12-11, 51 y.o.   MRN: [id number]  This visit occurred during the SARS-CoV-2 public health emergency.  Safety protocols were in place, including screening questions prior to the visit, additional usage of staff PPE, and extensive cleaning of exam room while observing appropriate contact time as indicated for disinfecting solutions.   HPI  [name] is a 51 y.o.-year-old female, referred by her ObGyn  Dr. Brien Mates, for management of hypothyroidism.  Pt. has been dx with hypothyroidism in 2000s during investigation for fatigue >> she was on on Synthroid d.a.w. (generic did not work for her), then changed to Armour thyroid 60 mg daily.  She does not feel much different on this.  She takes the thyroid hormone: - at bedtime, moved from the am - with water - separated by >30 min from b'fast  - no calcium, iron, PPIs, multivitamins  - + Melatonin at night - + Ashwagandha in am  I reviewed pt's thyroid tests: 12/03/2020: TSH 2.750, free T4 0.46  10/04/2020: TSH 0.698, free T4 0.66 02/26/2019: TSH 6.170 (0.45-4.5), free T4 0.99 (0.82-1.77) Lab Results  Component Value Date   TSH 1.25 09/23/2010   Antithyroid antibodies: No results found for: THGAB No components found for: TPOAB  Thyroid U/S (09/21/2020):  Parenchymal Echotexture: Normal Isthmus: 2 mm Right lobe: 3.9 x 0.9 x 1.3 cm Left lobe: 3.2 x 0.8 x 1.2 cm   Left inferior 4 mm cystic/solid nodule, not meeting criteria for follow-up or further investigation  Pt describes: - + weight gain: size 7 to 10 usually - feels she gained 40 lbs in last 2 years  - + fatigue - no cold intolerance; she has hot flashes - + Anxiety and depression - no constipation - no hair loss  Pt denies feeling nodules in neck, hoarseness, dysphagia/odynophagia, SOB with lying down.  She does describe occasional pressure in neck with drier foods (feels like neck closing up).  She has no FH  of thyroid disorders. No FH of thyroid cancer.  No h/o radiation tx to head or neck. No recent use of iodine supplements.  Pt. also has a history of Fibromyalgia.  She started Estradiol 1 mg daily  1 year ago. This helped with hot flushes, but now coming back.   ROS: Constitutional: + See HPI, + poor sleep Eyes: no blurry vision, no xerophthalmia ENT: no sore throat, no nodules palpated in throat, no dysphagia/odynophagia, no hoarseness, + hypoacusis Cardiovascular: no CP/SOB/palpitations/+ leg swelling Respiratory: no cough/SOB Gastrointestinal: + N/no V/D/C Musculoskeletal: + Both muscle/joint aches Skin: no rashes, + easy bruising, + itching Neurological: no tremors/numbness/tingling/dizziness Psychiatric: + both depression/anxiety  Past Medical History:  Diagnosis Date   Anxiety    under control    Depression    under control   Fibromyalgia 2008   Hypothyroidism    Thyroid disease    Past Surgical History:  Procedure Laterality Date   ABDOMINAL HYSTERECTOMY     ABLATION ON ENDOMETRIOSIS N/A 02/05/2013   Procedure: ABLATION ON ENDOMETRIOSIS;  Surgeon: Olga Millers, MD;  Location: [facility];  Service: Gynecology;  Laterality: N/A;   ANTERIOR AND POSTERIOR REPAIR N/A 09/26/2013   Procedure: EXCISION OF PROLAPSED FALLOPIAN TUBES/ VAGINAL CUFF REPAIR.;  Surgeon: Olga Millers, MD;  Location: [facility];  Service: Gynecology;  Laterality: N/A;  EXCISION OF PROLAPSED FALLOPIAN TUBE / VAGINAL CUFF REPAIR.   LAPAROSCOPY N/A 02/05/2013   Procedure: LAPAROSCOPY DIAGNOSTIC;  Surgeon: Olga Millers,  MD;  Location: [facility];  Service: Gynecology;  Laterality: N/A;   LAPAROSCOPY ABDOMEN DIAGNOSTIC     TONSILLECTOMY     VAGINAL HYSTERECTOMY N/A 02/05/2013   Procedure: HYSTERECTOMY VAGINAL;  Surgeon: Olga Millers, MD;  Location: [facility];  Service: Gynecology;  Laterality: N/A;   Social History   Socioeconomic History   Marital status: Married    Spouse name: Not on file   Number of  children: 1   Years of education: Not on file   Highest education level: Not on file  Occupational History   Not on file  Tobacco Use   Smoking status: Never   Smokeless tobacco: Never  Substance and Sexual Activity   Alcohol use: Yes   Drug use: No   Sexual activity: Yes    Birth control/protection: Pill, Surgical  Other Topics Concern   Not on file  Social History Narrative   Not on file   Social Determinants of Health   Financial Resource Strain: Not on file  Food Insecurity: Not on file  Transportation Needs: Not on file  Physical Activity: Not on file  Stress: Not on file  Social Connections: Not on file  Intimate Partner Violence: Not on file   Current Outpatient Medications on File Prior to Visit  Medication Sig Dispense Refill   DULoxetine HCl (CYMBALTA PO) Take by mouth.     estradiol (ESTRACE) 1 MG tablet Take 1 mg by mouth daily.     gabapentin (NEURONTIN) 300 MG capsule gabapentin 300 mg capsule  TAKE TWO CAPSULES BY MOUTH IN THE MORNING, TAKE TWO CAPSULES BY MOUTH IN THE AFTERNOON, AND TAKE THREE CAPSULES BY MOUTH IN THE EVENING     meloxicam (MOBIC) 15 MG tablet Take 1 tablet (15 mg total) by mouth daily. 30 tablet 3   thyroid (NP THYROID) 60 MG tablet Take 60 mg by mouth daily.     tiZANidine (ZANAFLEX) 4 MG tablet tizanidine 4 mg tablet  TAKE 1 TABLET BY MOUTH 3 TIMES DAILY AS NEEDED     traZODone (DESYREL) 50 MG tablet trazodone 50 mg tablet  TAKE 1 TABLET BY MOUTH EVERY DAY AT NIGHT     No current facility-administered medications on file prior to visit.   Allergies  Allergen Reactions   Aspirin     bleeding   Family History  Problem Relation Age of Onset   Arthritis Mother    Cancer Father    Hypertension Father    Heart disease Father     PE: BP 124/88 (BP Location: Left Arm, Patient Position: Sitting, Cuff Size: Normal)    Pulse 77    Ht 5\' 5"  (1.651 m)    Wt 163 lb (73.9 kg)    SpO2 97%    BMI 27.12 kg/m  Wt Readings from Last 3  Encounters:  05/31/21 163 lb (73.9 kg)  09/26/13 140 lb (63.5 kg)  02/05/13 131 lb (59.4 kg)   Constitutional: normal weight, in NAD Eyes: PERRLA, EOMI, no exophthalmos ENT: moist mucous membranes, no thyromegaly, no cervical lymphadenopathy Cardiovascular: RRR, No MRG Respiratory: CTA B Musculoskeletal: no deformities, strength intact in all 4 Skin: moist, warm, no rashes Neurological: no tremor with outstretched hands, DTR normal in all 4  ASSESSMENT: 1. Hypothyroidism  2.  Thyroid nodule  PLAN:  1. Patient with long-standing hypothyroidism, on desiccated thyroid extract. - she appears euthyroid, but does describe hot flashes (most likely postmenopausal), fatigue, weight gain.  We discussed that all of these may be related  to menopause.  She is on estradiol, started approximately a year ago. - We discussed about correct intake of thyroid hormones, fasting, with water, separated by at least 30 minutes from breakfast, and separated by more than 4 hours from calcium, iron, multivitamins, acid reflux medications (PPIs).  She is taking the Armour Thyroid at night and we discussed that this is not conducive to good absorption and good hypothyroidism control.  I strongly advised her to move into the morning. - will check thyroid tests today: TSH, free T3 free T4 to see if we do not need to reduce the dose, but we discussed that if the TSH is slightly higher, I would like to recheck this after she moves the thyroid hormones to the morning. - At today's visit, we will also check TPO and ATA antibodies to screen for Hashimoto's thyroiditis.  Discussed about this as being an autoimmune disease, the most common cause of hypothyroidism in [place].  I advised her that sometimes selenium may be recommended to try to lower the thyroid antibodies, and we can try this depending on the results.  Otherwise, we do not have specific treatments for this. - If labs today are abnormal, she will need to return in ~5-6  weeks for repeat labs - Otherwise, I will see her back in 4 months  2.  Thyroid nodule -vs small cyst -She does have problems swallowing dry foods, but unlikely to be related to this nodule -No family history of thyroid cancer or personal history of radiation therapy to head or neck to increase her risk for thyroid cancer -she has a very small 4 mm cystic/solid nodule in the left inferior lobe per review of her thyroid ultrasound from 09/2020 -We discussed that this is a benign appearing common occurrence and no intervention or further investigation is needed for this  Component     Latest Ref Rng & Units 05/31/2021  TSH     0.35 - 5.50 uIU/mL 1.76  T4,Free(Direct)     0.60 - 1.60 ng/dL 0.56 (L)  Triiodothyronine,Free,Serum     2.3 - 4.2 pg/mL 3.9  Thyroglobulin Ab     < or = 1 IU/mL <1  Thyroperoxidase Ab SerPl-aCnc     <9 IU/mL <1   TSH is normal.  Free T4 is slightly low, not unusual in the setting of desiccated thyroid treatment, while free T3 is normal. TPO and ATA antibodies are not elevated.  For now, I would suggest to switch to morning Armour therapy dosing and recheck her tests in 5 to 6 weeks.  Philemon Kingdom, MD PhD [facility] Endocr[facility]

## 2021-05-31 NOTE — Patient Instructions (Signed)
For now, continue Armour 60 mg daily but move this to am.  Take the thyroid hormone every day, with water, at least 30 minutes before breakfast, separated by at least 4 hours from: - acid reflux medications - calcium - iron - multivitamins  Please stop at the lab.  Please come back for a follow-up appointment in 3-4 months -

## 2021-06-01 LAB — THYROID PEROXIDASE ANTIBODY: Thyroperoxidase Ab SerPl-aCnc: <1 IU/mL (ref <9)

## 2021-06-01 LAB — THYROGLOBULIN ANTIBODY: Thyroglobulin Ab: <1 IU/mL (ref <=1)

## 2021-08-30 ENCOUNTER — Encounter: Payer: Self-pay | Admitting: Internal Medicine

## 2021-08-30 ENCOUNTER — Ambulatory Visit (INDEPENDENT_AMBULATORY_CARE_PROVIDER_SITE_OTHER): Payer: BC Managed Care – PPO | Admitting: Internal Medicine

## 2021-08-30 VITALS — BP 138/96 | HR 68 | Ht 65.0 in | Wt 166.0 lb

## 2021-08-30 DIAGNOSIS — E039 Hypothyroidism, unspecified: Secondary | ICD-10-CM | POA: Diagnosis not present

## 2021-08-30 DIAGNOSIS — E041 Nontoxic single thyroid nodule: Secondary | ICD-10-CM | POA: Diagnosis not present

## 2021-08-30 LAB — T4, FREE: Free T4: 0.59 ng/dL — ABNORMAL LOW (ref 0.60–1.60)

## 2021-08-30 LAB — TSH: TSH: 3.51 u[IU]/mL (ref 0.35–5.50)

## 2021-08-30 LAB — T3, FREE: T3, Free: 3.3 pg/mL (ref 2.3–4.2)

## 2021-08-30 NOTE — Progress Notes (Signed)
Patient ID: [name], female   DOB: June 13, 1970, 51 y.o.   MRN: [id number] ? ?This visit occurred during the SARS-CoV-2 public health emergency.  Safety protocols were in place, including screening questions prior to the visit, additional usage of staff PPE, and extensive cleaning of exam room while observing appropriate contact time as indicated for disinfecting solutions.  ? ?HPI  ?[name] is a 51 y.o.-year-old female, initially referred by her ObGyn  Dr. Brien Mates, returning for follow-up for hypothyroidism.  Last visit 3 months ago (our 1st visit). ? ?Interim history: ?She feels well without no complaints at today's visit.  Her hot flashes improved since last visit. ? ?Reviewed and addended history: ?Pt. has been dx with hypothyroidism in 2000s during investigation for fatigue >> she was on on Synthroid d.a.w. (generic did not work for her), then changed to Armour thyroid 60 mg daily.  She did not feel much different on this. ? ?At our first visit from 05/2021, we moved Armour from bedtime to the morning. ? ?She takes the thyroid hormone: ?- at bedtime >> moved in the am ?- with water ?- separated by >30 min from b'fast  ?- no calcium, iron, PPIs, multivitamins  ?- + Melatonin at night ?- + Ashwagandha in am ? ?I reviewed pt's thyroid tests: ?Lab Results  ?Component Value Date  ? TSH 1.76 05/31/2021  ? TSH 1.25 09/23/2010  ? FREET4 0.56 (L) 05/31/2021  ? T3FREE 3.9 05/31/2021  ?12/03/2020: TSH 2.750, free T4 0.46  ?10/04/2020: TSH 0.698, free T4 0.66 ?02/26/2019: TSH 6.170 (0.45-4.5), free T4 0.99 (0.82-1.77) ? ?Antithyroid antibodies: ?Component ?    Latest Ref Rng & Units 05/31/2021  ?Thyroglobulin Ab ?    < or = 1 IU/mL <1  ?Thyroperoxidase Ab SerPl-aCnc ?    <9 IU/mL <1  ? ?Thyroid U/S (09/21/2020):  ?Parenchymal Echotexture: Normal ?Isthmus: 2 mm ?Right lobe: 3.9 x 0.9 x 1.3 cm ?Left lobe: 3.2 x 0.8 x 1.2 cm ?  ?Left inferior 4 mm cystic/solid nodule, not meeting criteria for follow-up or further  investigation ? ?At last visit, she described: ?- + weight gain: size 7 to 10 usually - feels she gained 40 lbs since the coronavirus pandemic started ?- + fatigue ?- no cold intolerance; she has hot flashes ?- + Anxiety and depression ?- no constipation ?- no hair loss ? ?Pt denies feeling nodules in neck, hoarseness, dysphagia/odynophagia, SOB with lying down.   ?She still describes occasional pressure in neck with drier foods (feels she has a large neck). ? ?She has no FH of thyroid disorders. No FH of thyroid cancer.  ?No h/o radiation tx to head or neck. ?No recent use of iodine supplements. ? ?Pt. also has a history of Fibromyalgia.  ?She started Estradiol 1 mg daily before last visit -continues on this. ? ?ROS: ?Constitutional: + See HPI ? ?Past Medical History:  ?Diagnosis Date  ? Anxiety   ? under control   ? Depression   ? under control  ? Fibromyalgia 2008  ? Hypothyroidism   ? Thyroid disease   ? ?Past Surgical History:  ?Procedure Laterality Date  ? ABDOMINAL HYSTERECTOMY    ? ABLATION ON ENDOMETRIOSIS N/A 02/05/2013  ? Procedure: ABLATION ON ENDOMETRIOSIS;  Surgeon: Olga Millers, MD;  Location: [facility];  Service: Gynecology;  Laterality: N/A;  ? ANTERIOR AND POSTERIOR REPAIR N/A 09/26/2013  ? Procedure: EXCISION OF PROLAPSED FALLOPIAN TUBES/ VAGINAL CUFF REPAIR.;  Surgeon: Olga Millers, MD;  Location: [facility];  Service: Gynecology;  Laterality: N/A;  EXCISION OF PROLAPSED FALLOPIAN TUBE / VAGINAL CUFF REPAIR.  ? LAPAROSCOPY N/A 02/05/2013  ? Procedure: LAPAROSCOPY DIAGNOSTIC;  Surgeon: Olga Millers, MD;  Location: [facility];  Service: Gynecology;  Laterality: N/A;  ? LAPAROSCOPY ABDOMEN DIAGNOSTIC    ? TONSILLECTOMY    ? VAGINAL HYSTERECTOMY N/A 02/05/2013  ? Procedure: HYSTERECTOMY VAGINAL;  Surgeon: Olga Millers, MD;  Location: [facility];  Service: Gynecology;  Laterality: N/A;  ? ?Social History  ? ?Socioeconomic History  ? Marital status: Married  ?  Spouse name: Not on file  ? Number of children:  1  ? Years of education: Not on file  ? Highest education level: Not on file  ?Occupational History  ? Not on file  ?Tobacco Use  ? Smoking status: Never  ? Smokeless tobacco: Never  ?Substance and Sexual Activity  ? Alcohol use: Yes  ? Drug use: No  ? Sexual activity: Yes  ?  Birth control/protection: Pill, Surgical  ?Other Topics Concern  ? Not on file  ?Social History Narrative  ? Not on file  ? ?Social Determinants of Health  ? ?Financial Resource Strain: Not on file  ?Food Insecurity: Not on file  ?Transportation Needs: Not on file  ?Physical Activity: Not on file  ?Stress: Not on file  ?Social Connections: Not on file  ?Intimate Partner Violence: Not on file  ? ?Current Outpatient Medications on File Prior to Visit  ?Medication Sig Dispense Refill  ? DULoxetine HCl (CYMBALTA PO) Take by mouth.    ? estradiol (ESTRACE) 1 MG tablet Take 1 mg by mouth daily.    ? gabapentin (NEURONTIN) 300 MG capsule gabapentin 300 mg capsule ? TAKE TWO CAPSULES BY MOUTH IN THE MORNING, TAKE TWO CAPSULES BY MOUTH IN THE AFTERNOON, AND TAKE THREE CAPSULES BY MOUTH IN THE EVENING    ? meloxicam (MOBIC) 15 MG tablet Take 1 tablet (15 mg total) by mouth daily. 30 tablet 3  ? thyroid (NP THYROID) 60 MG tablet Take 60 mg by mouth daily.    ? tiZANidine (ZANAFLEX) 4 MG tablet tizanidine 4 mg tablet ? TAKE 1 TABLET BY MOUTH 3 TIMES DAILY AS NEEDED    ? traZODone (DESYREL) 50 MG tablet trazodone 50 mg tablet ? TAKE 1 TABLET BY MOUTH EVERY DAY AT NIGHT    ? ?No current facility-administered medications on file prior to visit.  ? ?Allergies  ?Allergen Reactions  ? Aspirin   ?  bleeding  ? ?Family History  ?Problem Relation Age of Onset  ? Arthritis Mother   ? Cancer Father   ? Hypertension Father   ? Heart disease Father   ? ? ?PE: ?BP (!) 138/96 (BP Location: Left Arm, Patient Position: Sitting, Cuff Size: Normal)   Pulse 68   Ht '5\' 5"'$  (1.651 m)   Wt 166 lb (75.3 kg)   SpO2 96%   BMI 27.62 kg/m?  ?Wt Readings from Last 3 Encounters:   ?08/30/21 166 lb (75.3 kg)  ?05/31/21 163 lb (73.9 kg)  ?09/26/13 140 lb (63.5 kg)  ? ?Constitutional: normal weight, in NAD ?Eyes: PERRLA, EOMI, no exophthalmos ?ENT: moist mucous membranes, no thyromegaly, no cervical lymphadenopathy ?Cardiovascular: RRR, No MRG ?Respiratory: CTA B ?Musculoskeletal: no deformities, strength intact in all 4 ?Skin: moist, warm, no rashes ?Neurological: no tremor with outstretched hands, DTR normal in all 4 ? ?ASSESSMENT: ?1.  Acquired hypothyroidism ? ?2.  Thyroid nodule ? ?PLAN:  ?1. Patient with longstanding  hypothyroidism, on desiccated thyroid extract with Armour. ?- At last visit she appears euthyroid but describes hot flashes, fatigue, weight gain.  We discussed that all of these were probably related to menopause.  She was on estradiol but did not feel that this is helping tremendously.  However, since last visit, the hot flashes at night improved after moving NP thyroid in the morning. ?- at last OV, we checked her for Hashimoto's thyroiditis and both the ATA and TPO antibodies were undetectable.  ?-- latest thyroid labs reviewed with pt. >> normal: ?Lab Results  ?Component Value Date  ? TSH 1.76 05/31/2021  ?- she continues on NP thyroid 60 mg daily ?- we discussed about taking the thyroid hormone every day, with water, >30 minutes before breakfast, separated by >4 hours from acid reflux medications, calcium, iron, multivitamins. Pt. is taking it correctly now -at last visit we moved the thyroid hormone from bedtime to morning. ?- will check thyroid tests today: TSH, free T4, and free T3  ?- If labs are abnormal, she will need to return for repeat TFTs in 1.5 months ?- OTW, I will see her back in 1 year ? ?2.  Thyroid nodule ?-Versus small cyst: 4 mm cystic/solid nodule in the left inferior lobe per review of her thyroid ultrasound report from 09/2020 ?-She continues to have problems swallowing dry foods, but unlikely to be related to this nodule ?-She does not have a  family history of thyroid cancer or personal history of radiation therapy to head or neck to increase her risk of thyroid cancer ?-No family history of thyroid cancer or personal history of radiation therapy t

## 2021-08-30 NOTE — Patient Instructions (Signed)
For now, continue Armour 60 mg daily in am. ? ?Take the thyroid hormone every day, with water, at least 30 minutes before breakfast, separated by at least 4 hours from: ?- acid reflux medications ?- calcium ?- iron ?- multivitamins ? ?Please stop at the lab. ? ?Please come back for a follow-up appointment in 1 year. ?- ?

## 2021-08-31 ENCOUNTER — Encounter: Payer: Self-pay | Admitting: Internal Medicine

## 2021-08-31 MED ORDER — NP THYROID 60 MG PO TABS: 60.0000 mg | ORAL_TABLET | Freq: Every day | ORAL | 3 refills | Status: AC

## 2022-09-05 ENCOUNTER — Ambulatory Visit: Payer: BC Managed Care – PPO | Admitting: Internal Medicine
